# Patient Record
Sex: Male | Born: 1970
Health system: Southern US, Community
[De-identification: ages and names within clinical notes are randomized; demographics above are authoritative.]

## PROBLEM LIST (undated history)

## (undated) DIAGNOSIS — G4730 Sleep apnea, unspecified: Secondary | ICD-10-CM

## (undated) DIAGNOSIS — M109 Gout, unspecified: Secondary | ICD-10-CM

## (undated) DIAGNOSIS — E785 Hyperlipidemia, unspecified: Secondary | ICD-10-CM

## (undated) DIAGNOSIS — E119 Type 2 diabetes mellitus without complications: Secondary | ICD-10-CM

## (undated) DIAGNOSIS — M1990 Unspecified osteoarthritis, unspecified site: Secondary | ICD-10-CM

## (undated) DIAGNOSIS — I10 Essential (primary) hypertension: Secondary | ICD-10-CM

## (undated) HISTORY — DX: Type 2 diabetes mellitus without complications: E11.9

## (undated) HISTORY — DX: Essential (primary) hypertension: I10

## (undated) HISTORY — DX: Sleep apnea, unspecified: G47.30

## (undated) HISTORY — PX: BICEPS TENDON REPAIR: SHX566

## (undated) HISTORY — DX: Hyperlipidemia, unspecified: E78.5

## (undated) HISTORY — DX: Unspecified osteoarthritis, unspecified site: M19.90

## (undated) HISTORY — DX: Gout, unspecified: M10.9

---

## 2014-03-01 DIAGNOSIS — S40029A Contusion of unspecified upper arm, initial encounter: Secondary | ICD-10-CM | POA: Insufficient documentation

## 2014-03-01 DIAGNOSIS — N200 Calculus of kidney: Secondary | ICD-10-CM | POA: Insufficient documentation

## 2014-03-01 DIAGNOSIS — M542 Cervicalgia: Secondary | ICD-10-CM | POA: Insufficient documentation

## 2014-03-01 DIAGNOSIS — G5600 Carpal tunnel syndrome, unspecified upper limb: Secondary | ICD-10-CM | POA: Insufficient documentation

## 2014-03-01 DIAGNOSIS — G4733 Obstructive sleep apnea (adult) (pediatric): Secondary | ICD-10-CM | POA: Insufficient documentation

## 2014-03-01 DIAGNOSIS — M25521 Pain in right elbow: Secondary | ICD-10-CM | POA: Insufficient documentation

## 2014-03-01 DIAGNOSIS — R490 Dysphonia: Secondary | ICD-10-CM | POA: Insufficient documentation

## 2014-03-01 DIAGNOSIS — M25539 Pain in unspecified wrist: Secondary | ICD-10-CM | POA: Insufficient documentation

## 2014-03-01 DIAGNOSIS — M109 Gout, unspecified: Secondary | ICD-10-CM | POA: Insufficient documentation

## 2014-03-01 DIAGNOSIS — R7301 Impaired fasting glucose: Secondary | ICD-10-CM | POA: Insufficient documentation

## 2014-03-01 DIAGNOSIS — E785 Hyperlipidemia, unspecified: Secondary | ICD-10-CM | POA: Insufficient documentation

## 2014-03-01 DIAGNOSIS — G4730 Sleep apnea, unspecified: Secondary | ICD-10-CM | POA: Insufficient documentation

## 2014-03-01 DIAGNOSIS — S46219A Strain of muscle, fascia and tendon of other parts of biceps, unspecified arm, initial encounter: Secondary | ICD-10-CM | POA: Insufficient documentation

## 2016-07-22 HISTORY — PX: KNEE SURGERY: SHX244

## 2017-07-22 HISTORY — PX: HERNIA REPAIR: SHX51

## 2017-10-30 DIAGNOSIS — K429 Umbilical hernia without obstruction or gangrene: Secondary | ICD-10-CM | POA: Insufficient documentation

## 2017-11-27 DIAGNOSIS — Z09 Encounter for follow-up examination after completed treatment for conditions other than malignant neoplasm: Secondary | ICD-10-CM | POA: Insufficient documentation

## 2017-12-07 NOTE — Progress Notes (Signed)
[name] [address] [place], [place] [address] Phone: [phone] Subjective:    I'm seeing this patient by the request  of:    CC: right leg pain   [id number]  Jesse [name] is a 47 y.o. male coming in with complaint of right leg pain. Pain is in the right leg and has been there for 2 months in the anterior portion of the hip and radiates down past the knee and sometimes into the foot. If patient stands or walks for prolonged periods he does note a weakness in the leg. Patient does do Crossfit and does not have pain with activity.    Past medical history is significant for kidney stones as well as hernia repair even this month.  History reviewed. No pertinent past medical history. History reviewed. No pertinent surgical history. Social History   Socioeconomic History  . Marital status: Single    Spouse name: Not on file  . Number of children: Not on file  . Years of education: Not on file  . Highest education level: Not on file  Occupational History  . Not on file  Social Needs  . Financial resource strain: Not on file  . Food insecurity:    Worry: Not on file    Inability: Not on file  . Transportation needs:    Medical: Not on file    Non-medical: Not on file  Tobacco Use  . Smoking status: Not on file  Substance and Sexual Activity  . Alcohol use: Not on file  . Drug use: Not on file  . Sexual activity: Not on file  Lifestyle  . Physical activity:    Days per week: Not on file    Minutes per session: Not on file  . Stress: Not on file  Relationships  . Social connections:    Talks on phone: Not on file    Gets together: Not on file    Attends religious service: Not on file    Active member of club or organization: Not on file    Attends meetings of clubs or organizations: Not on file    Relationship status: Not on file  Other Topics Concern  . Not on file  Social History Narrative  . Not on file   Not on  File History reviewed. No pertinent family history.   Past medical history, social, surgical and family history all reviewed in electronic medical record.  No pertanent information unless stated regarding to the chief complaint.   Review of Systems:Review of systems updated and as accurate as of 12/08/17  No headache, visual changes, nausea, vomiting, diarrhea, constipation, dizziness, abdominal pain, skin rash, fevers, chills, night sweats, weight loss, swollen lymph nodes, body aches, joint swelling,, chest pain, shortness of breath, mood changes.  Mild positive muscle aches  Objective  Blood pressure 118/80, pulse 79, height 6' (1.829 m), weight 297 lb (134.7 kg), SpO2 95 %. Systems examined below as of 12/08/17   General: No apparent distress alert and oriented x3 mood and affect normal, dressed appropriately.  HEENT: Pupils equal, extraocular movements intact  Respiratory: Patient's speak in full sentences and does not appear short of breath  Cardiovascular: No lower extremity edema, non tender, no erythema  Skin: Warm dry intact with no signs of infection or rash on extremities or on axial skeleton.  Abdomen: Soft incisions from recent surgery noted  neuro: Cranial nerves II through XII are intact, neurovascularly intact in all extremities with 2+ DTRs  and 2+ pulses.  Lymph: No lymphadenopathy of posterior or anterior cervical chain or axillae bilaterally.  Gait normal with good balance and coordination.  MSK:  Non tender with full range of motion and good stability and symmetric strength and tone of shoulders, elbows, wrist,  knee and ankles bilaterally.  Right hip exam shows the patient does have near full range of motion but does have significant tightness with Corky Sox test.  Patient on palpation has no significant pain noted though.  Negative grind test.  Does have some tightness lacking last 5 degrees in all planes  Low back exam shows some loss of lordosis and some tightness of the  paraspinal musculature.  Negative straight leg test.  Once again positive Corky Sox.  Strength is symmetric.  [id number]; 15 additional minutes spent for Therapeutic exercises as stated in above notes.  This included exercises focusing on stretching, strengthening, with significant focus on eccentric aspects.   Long term goals include an improvement in range of motion, strength, endurance as well as avoiding reinjury. Patient's frequency would include in 1-2 times a day, 3-5 times a week for a duration of 6-12 weeks. Hip strengthening exercises which included:  Pelvic tilt/bracing to help with proper recruitment of the lower abs and pelvic floor muscles  Glute strengthening to properly contract glutes without over-engaging low back and hamstrings - prone hip extension and glute bridge exercises Proper stretching techniques to increase effectiveness for the hip flexors, groin, quads, piriformic and low back when appropriate     Proper technique shown and discussed handout in great detail with ATC.  All questions were discussed and answered.      Impression and Recommendations:     This case required medical decision making of moderate complexity.      Note: This dictation was prepared with Dragon dictation along with smaller phrase technology. Any transcriptional errors that result from this process are unintentional.

## 2017-12-08 ENCOUNTER — Ambulatory Visit (INDEPENDENT_AMBULATORY_CARE_PROVIDER_SITE_OTHER)
Admission: RE | Admit: 2017-12-08 | Discharge: 2017-12-08 | Disposition: A | Discharge status: Home / Self Care | Payer: Commercial Managed Care - PPO | Source: Ambulatory Visit | Attending: Family Medicine | Admitting: Family Medicine

## 2017-12-08 ENCOUNTER — Encounter: Payer: Self-pay | Admitting: Family Medicine

## 2017-12-08 ENCOUNTER — Ambulatory Visit: Payer: Commercial Managed Care - PPO | Admitting: Family Medicine

## 2017-12-08 VITALS — BP 118/80 | HR 79 | Ht 72.0 in | Wt 297.0 lb

## 2017-12-08 DIAGNOSIS — M545 Low back pain, unspecified: Secondary | ICD-10-CM

## 2017-12-08 DIAGNOSIS — M79604 Pain in right leg: Secondary | ICD-10-CM

## 2017-12-08 MED ORDER — VITAMIN D (ERGOCALCIFEROL) 1.25 MG (50000 UNIT) PO CAPS: 50000.0000 [IU] | ORAL_CAPSULE | ORAL | 0 refills | Status: DC

## 2017-12-08 MED ORDER — GABAPENTIN 100 MG PO CAPS: 200.0000 mg | ORAL_CAPSULE | Freq: Every day | ORAL | 3 refills | Status: DC

## 2017-12-08 NOTE — Assessment & Plan Note (Signed)
Right anterior leg pain.  Discussed with patient in great length.  Discussed gabapentin.  X-rays ordered today.  Discussed icing regimen.  Discussed which activities to do which wants to avoid.  We discussed compression.  Differential includes a lumbar radiculopathy and x-rays ordered today.  I do believe that also possibly impingement could be a possibility.  Significant tightness of the hip flexors.  We discussed the pectineus as well as the obturator muscles being also within the differential.  We discussed vascular but seems to get better with activity which would go against it.  We discussed muscle strength and endurance.

## 2017-12-08 NOTE — Patient Instructions (Addendum)
Good to meet you  I think it is your hip flexor or your obturator or petineus muscle.  We will figure it out and focus on hip girdle strength and stretching first  Gabapentin 200mg  at night to help with any nerve pain  Over the counter try Tart cherry extract any dose at night When we do work out again will need compression shorts Xrays downstairs today  See me again in 4 weeks

## 2018-01-04 NOTE — Progress Notes (Signed)
[name] [place], [place] [address] Phone: [phone] Subjective:    I'm seeing this patient by the request  of:    CC: Back pain and right leg pain  [id number]  Jesse [name] is a 47 y.o. male coming in with complaint of back pain. He does continue to have numbness in the right leg. Is able to be active but does have numbness with activity.  Patient feeling rare worsening overall.  Patient states that it seems like he is having increasing weakness of the right leg.  Waking him up at night.  Even regular daily activity seems to be more difficult.     No past medical history on file. No past surgical history on file. Social History   Socioeconomic History  . Marital status: Single    Spouse name: Not on file  . Number of children: Not on file  . Years of education: Not on file  . Highest education level: Not on file  Occupational History  . Not on file  Social Needs  . Financial resource strain: Not on file  . Food insecurity:    Worry: Not on file    Inability: Not on file  . Transportation needs:    Medical: Not on file    Non-medical: Not on file  Tobacco Use  . Smoking status: Not on file  Substance and Sexual Activity  . Alcohol use: Not on file  . Drug use: Not on file  . Sexual activity: Not on file  Lifestyle  . Physical activity:    Days per week: Not on file    Minutes per session: Not on file  . Stress: Not on file  Relationships  . Social connections:    Talks on phone: Not on file    Gets together: Not on file    Attends religious service: Not on file    Active member of club or organization: Not on file    Attends meetings of clubs or organizations: Not on file    Relationship status: Not on file  Other Topics Concern  . Not on file  Social History Narrative  . Not on file   Not on File No family history on file.   Past medical history, social, surgical and family history all reviewed in  electronic medical record.  No pertanent information unless stated regarding to the chief complaint.   Review of Systems:Review of systems updated and as accurate as of 01/04/18  No headache, visual changes, nausea, vomiting, diarrhea, constipation, dizziness, abdominal pain, skin rash, fevers, chills, night sweats, weight loss, swollen lymph nodes, body aches, joint swelling, muscle aches, chest pain, shortness of breath, mood changes.   Objective  There were no vitals taken for this visit. Systems examined below as of 01/04/18   General: No apparent distress alert and oriented x3 mood and affect normal, dressed appropriately.  HEENT: Pupils equal, extraocular movements intact  Respiratory: Patient's speak in full sentences and does not appear short of breath  Cardiovascular: No lower extremity edema, non tender, no erythema  Skin: Warm dry intact with no signs of infection or rash on extremities or on axial skeleton.  Abdomen: Soft nondistended.  Patient did recently have surgery for umbilical hernia. Neuro: Cranial nerves II through XII are intact, neurovascularly intact in all extremities with and 2+ pulses.  Lymph: No lymphadenopathy of posterior or anterior cervical chain or axillae bilaterally.  Gait mild antalgic MSK:  Non tender with  full range of motion and good stability and symmetric strength and tone of shoulders, elbows, wrist, knee and ankles bilaterally.  Patient's lumbar spine shows loss of lordosis.  Poor core strength noted.  Patient does have a positive straight leg test on the right side.  4 out of 5 strength limbs on plantarflexion and dorsiflexion on the right.  Deep tendon reflexes 1+ on the right Achilles compared to the contralateral side. Unable to do [facility] test secondary to tightness of the hip girdle. Left leg fairly unremarkable.    Impression and Recommendations:     This case required medical decision making of moderate complexity.      Note: This  dictation was prepared with Dragon dictation along with smaller phrase technology. Any transcriptional errors that result from this process are unintentional.

## 2018-01-05 ENCOUNTER — Ambulatory Visit (INDEPENDENT_AMBULATORY_CARE_PROVIDER_SITE_OTHER): Payer: Commercial Managed Care - PPO | Admitting: Family Medicine

## 2018-01-05 ENCOUNTER — Ambulatory Visit: Payer: Self-pay

## 2018-01-05 VITALS — BP 140/80 | HR 82 | Ht 72.0 in | Wt 304.0 lb

## 2018-01-05 DIAGNOSIS — R200 Anesthesia of skin: Secondary | ICD-10-CM

## 2018-01-05 DIAGNOSIS — M5416 Radiculopathy, lumbar region: Secondary | ICD-10-CM | POA: Diagnosis not present

## 2018-01-05 NOTE — Patient Instructions (Signed)
Good to see you  Jesse [name] is your friend.  Stay active We will get MRI of your back due to the :weakness":  I will write you on the findings I would like to see you again in 3 weeks no matter what

## 2018-01-05 NOTE — Assessment & Plan Note (Signed)
Patient was found to have more of a lumbar radiculopathy.  Discussed with patient in great length.  We discussed icing regimen.  Patient was having weakness.  Patient has had this long-standing for some time.  I do believe that advanced imaging would be beneficial at this point.  Patient will have an MRI of the back.  X-rays have already shown some mild osteoarthritic changes.  Patient has weakness of the lower extremity and even decreasing tendon reflexes.  Could be a candidate for epidural injections.  In addition to this encourage patient to increase the gabapentin to 300 mg in the interim.  Patient was worsening symptoms to seek medical attention immediately.  There was a concern for patient having an atypical hernia of the upper inner but I think this is a low likelihood compared to the findings noted today.

## 2018-01-12 ENCOUNTER — Encounter: Payer: Self-pay | Admitting: Family Medicine

## 2018-01-12 ENCOUNTER — Other Ambulatory Visit: Payer: Self-pay

## 2018-01-12 DIAGNOSIS — G8929 Other chronic pain: Secondary | ICD-10-CM

## 2018-01-12 DIAGNOSIS — M545 Low back pain: Principal | ICD-10-CM

## 2018-01-12 NOTE — Telephone Encounter (Signed)
Done

## 2018-01-23 ENCOUNTER — Ambulatory Visit
Admission: RE | Admit: 2018-01-23 | Discharge: 2018-01-23 | Disposition: A | Discharge status: Home / Self Care | Payer: Commercial Managed Care - PPO | Source: Ambulatory Visit | Attending: Family Medicine | Admitting: Family Medicine

## 2018-01-23 DIAGNOSIS — G8929 Other chronic pain: Secondary | ICD-10-CM

## 2018-01-23 DIAGNOSIS — M545 Low back pain: Principal | ICD-10-CM

## 2018-01-23 MED ORDER — METHYLPREDNISOLONE ACETATE 40 MG/ML INJ SUSP (RADIOLOG
120.0000 mg | Freq: Once | INTRAMUSCULAR | Status: AC
  Administered 2018-01-23: 120 mg via EPIDURAL

## 2018-01-23 MED ORDER — IOPAMIDOL (ISOVUE-M 200) INJECTION 41%
1.0000 mL | Freq: Once | INTRAMUSCULAR | Status: AC
  Administered 2018-01-23: 1 mL via EPIDURAL

## 2018-01-23 NOTE — Discharge Instructions (Signed)

## 2018-02-04 NOTE — Progress Notes (Signed)
[name] [address] [place], [place] [address] Phone: [phone] Subjective:     CC: Back pain  [id number]  Jesse [name] is a 47 y.o. male coming in with complaint of back pain. He had an epidural which helped the numbness in the right leg. He does feel like the numbness is starting to come back. Denies any pain in the back.  Doing well with the gabapentin.  Has tried 300 mg.  MRI did show multilevel degenerative changes within L4-L5 3 foraminal stenosis causing nerve impingement.  Also had a possible pars defect at L5    No past medical history on file. No past surgical history on file. Social History   Socioeconomic History  . Marital status: Single    Spouse name: Not on file  . Number of children: Not on file  . Years of education: Not on file  . Highest education level: Not on file  Occupational History  . Not on file  Social Needs  . Financial resource strain: Not on file  . Food insecurity:    Worry: Not on file    Inability: Not on file  . Transportation needs:    Medical: Not on file    Non-medical: Not on file  Tobacco Use  . Smoking status: Not on file  Substance and Sexual Activity  . Alcohol use: Not on file  . Drug use: Not on file  . Sexual activity: Not on file  Lifestyle  . Physical activity:    Days per week: Not on file    Minutes per session: Not on file  . Stress: Not on file  Relationships  . Social connections:    Talks on phone: Not on file    Gets together: Not on file    Attends religious service: Not on file    Active member of club or organization: Not on file    Attends meetings of clubs or organizations: Not on file    Relationship status: Not on file  Other Topics Concern  . Not on file  Social History Narrative  . Not on file   Not on File No family history on file.   Past medical history, social, surgical and family history all reviewed in electronic medical record.  No pertanent  information unless stated regarding to the chief complaint.   Review of Systems:Review of systems updated and as accurate as of 02/05/18  No headache, visual changes, nausea, vomiting, diarrhea, constipation, dizziness, abdominal pain, skin rash, fevers, chills, night sweats, weight loss, swollen lymph nodes, body aches, joint swelling, muscle aches, chest pain, shortness of breath, mood changes.   Objective  Blood pressure 128/86, pulse (!) 105, height 6' (1.829 m), weight (!) 303 lb (137.4 kg), SpO2 96 %. Systems examined below as of 02/05/18   General: No apparent distress alert and oriented x3 mood and affect normal, dressed appropriately.  HEENT: Pupils equal, extraocular movements intact  Respiratory: Patient's speak in full sentences and does not appear short of breath  Cardiovascular: No lower extremity edema, non tender, no erythema  Skin: Warm dry intact with no signs of infection or rash on extremities or on axial skeleton.  Abdomen: Soft nontender  Neuro: Cranial nerves II through XII are intact, neurovascularly intact in all extremities with 2+ DTRs and 2+ pulses.  Lymph: No lymphadenopathy of posterior or anterior cervical chain or axillae bilaterally.  Gait normal with good balance and coordination.  MSK:  Non tender with full  range of motion and good stability and symmetric strength and tone of shoulders, elbows, wrist, hip, knee and ankles bilaterally.  Back Exam:  Inspection: Mild loss of lordosis Motion: Flexion 45 deg, Extension 20 deg, Side Bending to 25 deg bilaterally,  Rotation to 25 deg bilaterally  SLR laying: Negative  XSLR laying: Negative  Palpable tenderness: Tender to palpation the paraspinal musculature.  Seems to be more on the right side of the left. FABER: Severe tightness bilaterally especially on the right. Sensory change: Gross sensation intact to all lumbar and sacral dermatomes.  Reflexes: 2+ at both patellar tendons, 2+ at achilles tendons,  Babinski's downgoing.  Strength at foot  Plantar-flexion: 5/5 Dorsi-flexion: 5/5 Eversion: 5/5 Inversion: 5/5  Leg strength  Quad: 5/5 Hamstring: 5/5 Hip flexor: 5/5 Hip abductors: 4/5 but symmetric Gait unremarkable.    Impression and Recommendations:     This case required medical decision making of moderate complexity.      Note: This dictation was prepared with Dragon dictation along with smaller phrase technology. Any transcriptional errors that result from this process are unintentional.

## 2018-02-05 ENCOUNTER — Ambulatory Visit (INDEPENDENT_AMBULATORY_CARE_PROVIDER_SITE_OTHER): Payer: Commercial Managed Care - PPO | Admitting: Family Medicine

## 2018-02-05 DIAGNOSIS — M5416 Radiculopathy, lumbar region: Secondary | ICD-10-CM

## 2018-02-05 NOTE — Patient Instructions (Addendum)
Good to see you  Jesse [name] is your friend.  Try our exercises 2-3 times a week  Ok to increase activity as tolerated Play with the gabapentin and see if 300mg  helps and then can refill at that dose if you want.  See me again in 4-6 weeks to make sure you are doing well

## 2018-02-05 NOTE — Assessment & Plan Note (Signed)
Lumbar radiculopathy.  We discussed icing regimen and home exercises.  Encourage patient to continue with the gabapentin.  Is okay to increase activity.  If worsening symptoms may need to consider another epidural.  Follow-up again in 4 to 6 weeks

## 2018-03-05 ENCOUNTER — Encounter: Payer: Self-pay | Admitting: Family Medicine

## 2018-03-05 ENCOUNTER — Ambulatory Visit (INDEPENDENT_AMBULATORY_CARE_PROVIDER_SITE_OTHER): Payer: Commercial Managed Care - PPO | Admitting: Family Medicine

## 2018-03-05 DIAGNOSIS — M79604 Pain in right leg: Secondary | ICD-10-CM

## 2018-03-05 NOTE — Assessment & Plan Note (Signed)
Possible labral pathology noted of the hip now with a positive grind test.  We discussed if continuing to have pain at follow-up we will consider the possibility of injection.  Patient will consider this.  Continue with conservative therapy at this time.  Follow-up again in 4 to 6 weeks

## 2018-03-05 NOTE — Progress Notes (Signed)
[name] [address], [place] [address] Phone: [phone] Subjective:      CC: Back pain  [id number]  Jesse [name] is a 47 y.o. male coming in with complaint of back pain. The numbness in the right leg has improved but does increase with prolonged walking. Is developing pain on left side of lumbar spine. Pain increases with sitting or laying for prolonged periods.    Patient did have x-rays of the pelvis done Dec 08, 2017 showing minimal degenerative changes of the hips.  MRI of the back did show mild arthritic changes but did have a pars defect noted at L5-S1  No past medical history on file. No past surgical history on file. Social History   Socioeconomic History  . Marital status: Single    Spouse name: Not on file  . Number of children: Not on file  . Years of education: Not on file  . Highest education level: Not on file  Occupational History  . Not on file  Social Needs  . Financial resource strain: Not on file  . Food insecurity:    Worry: Not on file    Inability: Not on file  . Transportation needs:    Medical: Not on file    Non-medical: Not on file  Tobacco Use  . Smoking status: Not on file  Substance and Sexual Activity  . Alcohol use: Not on file  . Drug use: Not on file  . Sexual activity: Not on file  Lifestyle  . Physical activity:    Days per week: Not on file    Minutes per session: Not on file  . Stress: Not on file  Relationships  . Social connections:    Talks on phone: Not on file    Gets together: Not on file    Attends religious service: Not on file    Active member of club or organization: Not on file    Attends meetings of clubs or organizations: Not on file    Relationship status: Not on file  Other Topics Concern  . Not on file  Social History Narrative  . Not on file   Not on File No family history on file.  No family history of autoimmune   Past medical history, social,  surgical and family history all reviewed in electronic medical record.  No pertanent information unless stated regarding to the chief complaint.   Review of Systems:Review of systems updated and as accurate as of 03/05/18  No headache, visual changes, nausea, vomiting, diarrhea, constipation, dizziness, abdominal pain, skin rash, fevers, chills, night sweats, weight loss, swollen lymph nodes, body aches, joint swelling, chest pain, shortness of breath, mood changes.  Positive muscle aches  Objective  Blood pressure 128/82, pulse (!) 111, height 6' (1.829 m), weight (!) 303 lb (137.4 kg), SpO2 96 %. Systems examined below as of 03/05/18   General: No apparent distress alert and oriented x3 mood and affect normal, dressed appropriately.  HEENT: Pupils equal, extraocular movements intact  Respiratory: Patient's speak in full sentences and does not appear short of breath  Cardiovascular: No lower extremity edema, non tender, no erythema  Skin: Warm dry intact with no signs of infection or rash on extremities or on axial skeleton.  Abdomen: Soft nontender  Neuro: Cranial nerves II through XII are intact, neurovascularly intact in all extremities with 2+ DTRs and 2+ pulses.  Lymph: No lymphadenopathy of posterior or anterior cervical chain or axillae bilaterally.  Gait normal with good balance and coordination.  MSK:  Non tender with full range of motion and good stability and symmetric strength and tone of shoulders, elbows, wrist,  knee and ankles bilaterally.    Right hip exam shows that patient does have some decreased range of motion of all planes of known positive grind test which is new.  Back exam shows significant tightness of the lumbar spine.  Negative straight leg test.  Mild positive Corky Sox test on the right side.  Neurovascular intact distally with 5 out of 5 strength   Impression and Recommendations:     This case required medical decision making of moderate complexity.       Note: This dictation was prepared with Dragon dictation along with smaller phrase technology. Any transcriptional errors that result from this process are unintentional.

## 2018-03-05 NOTE — Patient Instructions (Signed)
Hip labral tear See me in 3 weeks.

## 2018-03-09 ENCOUNTER — Telehealth: Payer: Self-pay | Admitting: *Deleted

## 2018-03-09 MED ORDER — GABAPENTIN 100 MG PO CAPS: 200.0000 mg | ORAL_CAPSULE | Freq: Every day | ORAL | 1 refills | Status: DC

## 2018-03-09 MED ORDER — VITAMIN D (ERGOCALCIFEROL) 1.25 MG (50000 UNIT) PO CAPS: 50000.0000 [IU] | ORAL_CAPSULE | ORAL | 0 refills | Status: DC

## 2018-03-09 NOTE — Addendum Note (Signed)
Addended by: Douglass Rivers T on: 03/09/2018 01:39 PM   Modules accepted: Orders

## 2018-03-09 NOTE — Telephone Encounter (Signed)
Refill request for Gabapentin 100mg .  rx sent to pharmacy.

## 2018-03-25 ENCOUNTER — Ambulatory Visit (INDEPENDENT_AMBULATORY_CARE_PROVIDER_SITE_OTHER): Payer: Commercial Managed Care - PPO | Admitting: Family Medicine

## 2018-03-25 ENCOUNTER — Encounter: Payer: Self-pay | Admitting: Family Medicine

## 2018-03-25 VITALS — BP 160/90 | HR 74 | Ht 72.0 in | Wt 306.0 lb

## 2018-03-25 DIAGNOSIS — M79604 Pain in right leg: Secondary | ICD-10-CM

## 2018-03-25 DIAGNOSIS — R0989 Other specified symptoms and signs involving the circulatory and respiratory systems: Secondary | ICD-10-CM | POA: Diagnosis not present

## 2018-03-25 DIAGNOSIS — M25551 Pain in right hip: Secondary | ICD-10-CM

## 2018-03-25 NOTE — Progress Notes (Signed)
[name] [address], [place] [address] Phone: [phone] Subjective:     I [name] am serving as a Education administrator for Dr. Hulan Saas.   CC: Right leg pain follow-up  [id number]  Jesse [name] is a 47 y.o. male coming in with complaint of right leg pain. States that his leg is the same as last visit. Gets numbness past his knee. Was seen previously back and did not find that patient did have some degenerative disc disease in the attempted a epidural.  Helped him with his back pain but continues to have more of the hip pain.  Still having this anterior pain in the hip and abdominal region.  Very intermittent.  Sometimes with activity and sometimes not.  Patient states that standing for long amount of time seems to aggravate it.  Patient states that he was able to work out without any significant pain this morning though.  Patient though is concerned because he is still not able to do all the activities he would like.  Been going on now multiple months and has not had any significant improvement.     No past medical history on file. No past surgical history on file. Social History   Socioeconomic History  . Marital status: Single    Spouse name: Not on file  . Number of children: Not on file  . Years of education: Not on file  . Highest education level: Not on file  Occupational History  . Not on file  Social Needs  . Financial resource strain: Not on file  . Food insecurity:    Worry: Not on file    Inability: Not on file  . Transportation needs:    Medical: Not on file    Non-medical: Not on file  Tobacco Use  . Smoking status: Not on file  Substance and Sexual Activity  . Alcohol use: Not on file  . Drug use: Not on file  . Sexual activity: Not on file  Lifestyle  . Physical activity:    Days per week: Not on file    Minutes per session: Not on file  . Stress: Not on file  Relationships  . Social connections:    Talks  on phone: Not on file    Gets together: Not on file    Attends religious service: Not on file    Active member of club or organization: Not on file    Attends meetings of clubs or organizations: Not on file    Relationship status: Not on file  Other Topics Concern  . Not on file  Social History Narrative  . Not on file   Not on File No family history on file.   Current Outpatient Medications (Cardiovascular):  .  fenofibrate (TRICOR) 48 MG tablet, Take 200 mg by mouth daily.  [name]  lisinopril (PRINIVIL,ZESTRIL) 20 MG tablet, Take 20 mg by mouth daily.   Current Outpatient Medications (Analgesics):  .  allopurinol (ZYLOPRIM) 300 MG tablet, Take 300 mg by mouth daily.   Current Outpatient Medications (Other):  .  acyclovir (ZOVIRAX) 400 MG tablet, Take 400 mg by mouth 2 (two) times daily.  [name]  buPROPion (WELLBUTRIN) 75 MG tablet, Take 300 mg by mouth daily.  [name]  gabapentin (NEURONTIN) 100 MG capsule, Take 2 capsules (200 mg total) by mouth at bedtime. .  Vitamin D, Ergocalciferol, (DRISDOL) 50000 units CAPS capsule, Take 1 capsule (50,000 Units total) by mouth every 7 (seven) days.  Past medical history, social, surgical and family history all reviewed in electronic medical record.  No pertanent information unless stated regarding to the chief complaint.   Review of Systems:  No headache, visual changes, nausea, vomiting, diarrhea, constipation, dizziness, abdominal pain, skin rash, fevers, chills, night sweats, weight loss, swollen lymph nodes, body aches, joint swelling,, chest pain, shortness of breath, mood changes.  Positive muscle aches  Objective  Blood pressure (!) 160/90, pulse 74, height 6' (1.829 m), weight (!) 306 lb (138.8 kg), SpO2 96 %.    General: No apparent distress alert and oriented x3 mood and affect normal, dressed appropriately.  HEENT: Pupils equal, extraocular movements intact  Respiratory: Patient's speak in full sentences and does not appear short of  breath  Cardiovascular: No lower extremity edema, non tender, no erythema  Skin: Warm dry intact with no signs of infection or rash on extremities or on axial skeleton.  Abdomen: Soft mild tenderness in the right lower quadrant. Neuro: Cranial nerves II through XII are intact, neurovascularly intact in all extremities with 2+ DTRs and 2+ pulses.  Lymph: No lymphadenopathy of posterior or anterior cervical chain or axillae bilaterally.  Gait normal with good balance and coordination.  MSK:  Non tender with full range of motion and good stability and symmetric strength and tone of shoulders, elbows, wrist,  knee and ankles bilaterally.  Back exam is improved with very minimal tenderness.  Patient does have a positive Corky Sox on the right hip increased pain with grind test of the right hip and internal rotation.  Patient though does have some pain with flexion of the hip but more anterior than posterior.  No radicular symptoms.  Neurovascular intact distally.    Impression and Recommendations:     This case required medical decision making of moderate complexity. The above documentation has been reviewed and is accurate and complete [name], DO       Note: This dictation was prepared with Dragon dictation along with smaller phrase technology. Any transcriptional errors that result from this process are unintentional.

## 2018-03-25 NOTE — Assessment & Plan Note (Signed)
Very difficult to assess at this time.  Patient has done all the formal physical therapy, home exercise, multiple medications.  Laboratory work-up fairly unremarkable.  Doing the once weekly vitamin D as well as taking gout medications with no significant improvement overall.  Patient's pain seems to come and go.  Differential includes a hip labral tear that is not seen at the moment or even an occult hernia.  Hip flexor tendinitis could be within the differential.  No fevers or chills and likely not an abscess.  Patient though could have some possible pseudoclaudication occurring as well and I feel that an MRI of the pelvis with and without contrast as well as an MRI arthrogram of the hip would be beneficial at this point to help with aid in proper treatment.  Patient is in agreement with the plan and will follow-up with me after imaging to discuss further.  Spent  25 minutes with patient face-to-face and had greater than 50% of counseling including as described above in assessment and plan.

## 2018-03-25 NOTE — Patient Instructions (Signed)
Good to see you  Ice 20 minutes 2 times daily. Usually after activity and before bed. MRI are ordered and give it a day then call [phone] to schedule.  Continue the gabapentin  I will also start exercises for the shoulders Keep hands within peripheral vision  On wall with heels, butt shoulder and head touching for a goal of 5 minutes daily  We will try for an adjustable standing desk  I will write you when we get the MRI back but make an appointment to have on the books in 4 weeks

## 2018-04-05 ENCOUNTER — Ambulatory Visit
Admission: RE | Admit: 2018-04-05 | Discharge: 2018-04-05 | Disposition: A | Discharge status: Home / Self Care | Payer: Commercial Managed Care - PPO | Source: Ambulatory Visit | Attending: Family Medicine | Admitting: Family Medicine

## 2018-04-05 DIAGNOSIS — R0989 Other specified symptoms and signs involving the circulatory and respiratory systems: Secondary | ICD-10-CM

## 2018-04-05 MED ORDER — GADOBENATE DIMEGLUMINE 529 MG/ML IV SOLN
20.0000 mL | Freq: Once | INTRAVENOUS | Status: AC | PRN
  Administered 2018-04-05: 20 mL via INTRAVENOUS

## 2018-04-06 ENCOUNTER — Encounter: Payer: Self-pay | Admitting: Family Medicine

## 2018-04-16 ENCOUNTER — Ambulatory Visit
Admission: RE | Admit: 2018-04-16 | Discharge: 2018-04-16 | Disposition: A | Discharge status: Home / Self Care | Payer: Commercial Managed Care - PPO | Source: Ambulatory Visit | Attending: Family Medicine | Admitting: Family Medicine

## 2018-04-16 DIAGNOSIS — M25551 Pain in right hip: Secondary | ICD-10-CM

## 2018-04-16 MED ORDER — IOPAMIDOL (ISOVUE-M 200) INJECTION 41%
15.0000 mL | Freq: Once | INTRAMUSCULAR | Status: AC
  Administered 2018-04-16: 15 mL via INTRA_ARTICULAR

## 2018-04-25 NOTE — Progress Notes (Signed)
[name] [place], [place] [address] Phone: [phone] Subjective:    I Jesse [name] am serving as a Education administrator for Dr. Hulan Saas.   CC: Leg pain follow-up  [id number]  Jesse [name] is a 47 y.o. male coming in with complaint of right hip pain. States that his hip is not doing well. Has had 2 MRI's since the last visit.  These MRIs were independently visualized by me.  Patient's pelvic MRI did show the patient did have degenerative tearing of the labrum noted but also advanced osteoarthritic changes of the hip but seems to be asymmetric.  Seems to be only on the right side significantly.        No past medical history on file. No past surgical history on file. Social History   Socioeconomic History  . Marital status: Single    Spouse name: Not on file  . Number of children: Not on file  . Years of education: Not on file  . Highest education level: Not on file  Occupational History  . Not on file  Social Needs  . Financial resource strain: Not on file  . Food insecurity:    Worry: Not on file    Inability: Not on file  . Transportation needs:    Medical: Not on file    Non-medical: Not on file  Tobacco Use  . Smoking status: Not on file  Substance and Sexual Activity  . Alcohol use: Not on file  . Drug use: Not on file  . Sexual activity: Not on file  Lifestyle  . Physical activity:    Days per week: Not on file    Minutes per session: Not on file  . Stress: Not on file  Relationships  . Social connections:    Talks on phone: Not on file    Gets together: Not on file    Attends religious service: Not on file    Active member of club or organization: Not on file    Attends meetings of clubs or organizations: Not on file    Relationship status: Not on file  Other Topics Concern  . Not on file  Social History Narrative  . Not on file   Not on File No family history on file.   Current Outpatient  Medications (Cardiovascular):  .  fenofibrate (TRICOR) 48 MG tablet, Take 200 mg by mouth daily.  [name]  lisinopril (PRINIVIL,ZESTRIL) 20 MG tablet, Take 20 mg by mouth daily.   Current Outpatient Medications (Analgesics):  .  allopurinol (ZYLOPRIM) 300 MG tablet, Take 300 mg by mouth daily. .  meloxicam (MOBIC) 15 MG tablet, Take 1 tablet (15 mg total) by mouth daily.   Current Outpatient Medications (Other):  .  acyclovir (ZOVIRAX) 400 MG tablet, Take 400 mg by mouth 2 (two) times daily.  [name]  buPROPion (WELLBUTRIN) 75 MG tablet, Take 300 mg by mouth daily.  [name]  gabapentin (NEURONTIN) 100 MG capsule, Take 2 capsules (200 mg total) by mouth at bedtime. .  Vitamin D, Ergocalciferol, (DRISDOL) 50000 units CAPS capsule, Take 1 capsule (50,000 Units total) by mouth every 7 (seven) days.    Past medical history, social, surgical and family history all reviewed in electronic medical record.  No pertanent information unless stated regarding to the chief complaint.   Review of Systems:  No headache, visual changes, nausea, vomiting, diarrhea, constipation, dizziness, abdominal pain, skin rash, fevers, chills, night sweats, weight loss, swollen lymph nodes, body aches,  joint swelling,  chest pain, shortness of breath, mood changes.  Positive muscle aches  Objective  Blood pressure (!) 160/82, pulse 81, height 6' (1.829 m), weight (!) 306 lb (138.8 kg), SpO2 96 %.   General: No apparent distress alert and oriented x3 mood and affect normal, dressed appropriately.  HEENT: Pupils equal, extraocular movements intact  Respiratory: Patient's speak in full sentences and does not appear short of breath  Cardiovascular: No lower extremity edema, non tender, no erythema  Skin: Warm dry intact with no signs of infection or rash on extremities or on axial skeleton.  Abdomen: Soft nontender  Neuro: Cranial nerves II through XII are intact, neurovascularly intact in all extremities with 2+ DTRs and 2+ pulses.    Lymph: No lymphadenopathy of posterior or anterior cervical chain or axillae bilaterally.  Gait antalgic MSK:  Non tender with full range of motion and good stability and symmetric strength and tone of shoulders, elbows, wrist,  knee and ankles bilaterally.  Right hip exam shows decreased range of motion in all planes with only 5 degrees of internal range of motion.  Negative straight leg test.  Does have 4 out of 5 strength of hip flexion.     Impression and Recommendations:     The above documentation has been reviewed and is accurate and complete Jesse Pulley, DO       Note: This dictation was prepared with Dragon dictation along with smaller phrase technology. Any transcriptional errors that result from this process are unintentional.

## 2018-04-27 ENCOUNTER — Encounter: Payer: Self-pay | Admitting: *Deleted

## 2018-04-27 ENCOUNTER — Encounter: Payer: Self-pay | Admitting: Family Medicine

## 2018-04-27 ENCOUNTER — Ambulatory Visit (INDEPENDENT_AMBULATORY_CARE_PROVIDER_SITE_OTHER): Payer: Commercial Managed Care - PPO | Admitting: Family Medicine

## 2018-04-27 VITALS — BP 160/82 | HR 81 | Ht 72.0 in | Wt 306.0 lb

## 2018-04-27 DIAGNOSIS — M25551 Pain in right hip: Secondary | ICD-10-CM

## 2018-04-27 DIAGNOSIS — M1611 Unilateral primary osteoarthritis, right hip: Secondary | ICD-10-CM | POA: Diagnosis not present

## 2018-04-27 MED ORDER — MELOXICAM 15 MG PO TABS: 15.0000 mg | ORAL_TABLET | Freq: Every day | ORAL | 3 refills | Status: DC

## 2018-04-27 NOTE — Patient Instructions (Addendum)
Good to see you  Ice is your friend Continue the vitamins We will get you in with [place].  They will talk after and write me and we will discuss the options.  Meloxicam daily for 10 days then as needed

## 2018-04-27 NOTE — Assessment & Plan Note (Signed)
Moderate to severe with patient having a degenerative labral tear.  Will refer to orthopedic surgery to see if any possibility of resurfacing as well as labral repair or removal could be beneficial in the same patient from having a hip replacement at the age of 48.  We discussed and encourage patient to continue to stay active but avoid high impact.  We discussed weight loss.  Discussed all other conservative measures.  Patient will discuss with orthopedic surgery in the come back and discuss with [place].  Spent  25 minutes with patient face-to-face and had greater than 50% of counseling including as described above in assessment and plan.

## 2018-05-15 ENCOUNTER — Other Ambulatory Visit: Payer: Self-pay | Admitting: Family Medicine

## 2018-05-15 NOTE — Telephone Encounter (Signed)
Refill done.  

## 2018-07-20 ENCOUNTER — Encounter: Payer: Self-pay | Admitting: Family Medicine

## 2018-07-29 NOTE — Progress Notes (Signed)
[name] [address] [place], [place] [address] Phone: [phone] Subjective:    CC: Right hip pain  [id number]:[id number]  Jesse [name] is a 48 y.o. male coming in with complaint of right hip pain.  Patient has had this for quite some time.  Was sent for an MRI and MRI did show severe osteoarthritic changes of the hip.  Patient states that he did see surgeon. Was told he would have to have replacement in 2-5 years.   Patient also has started to have left hip pain. Has radiating pain into left leg. Feels like it coming from his back. Does feel like it is improving. Pain is anterior and travels through quad to lateral gastroc. Also has some pain over lateral hip. Does wake up from pain. Sitting and lying down increases his pain while walking improves his pain.     No past medical history on file. No past surgical history on file. Social History   Socioeconomic History  . Marital status: Single    Spouse name: Not on file  . Number of children: Not on file  . Years of education: Not on file  . Highest education level: Not on file  Occupational History  . Not on file  Social Needs  . Financial resource strain: Not on file  . Food insecurity:    Worry: Not on file    Inability: Not on file  . Transportation needs:    Medical: Not on file    Non-medical: Not on file  Tobacco Use  . Smoking status: Not on file  Substance and Sexual Activity  . Alcohol use: Not on file  . Drug use: Not on file  . Sexual activity: Not on file  Lifestyle  . Physical activity:    Days per week: Not on file    Minutes per session: Not on file  . Stress: Not on file  Relationships  . Social connections:    Talks on phone: Not on file    Gets together: Not on file    Attends religious service: Not on file    Active member of club or organization: Not on file    Attends meetings of clubs or organizations: Not on file    Relationship status: Not on file  Other Topics  Concern  . Not on file  Social History Narrative  . Not on file   Not on File No family history on file.   Current Outpatient Medications (Cardiovascular):  .  fenofibrate (TRICOR) 48 MG tablet, Take 200 mg by mouth daily.  [name]  lisinopril (PRINIVIL,ZESTRIL) 20 MG tablet, Take 20 mg by mouth daily.   Current Outpatient Medications (Analgesics):  .  allopurinol (ZYLOPRIM) 300 MG tablet, Take 300 mg by mouth daily. .  meloxicam (MOBIC) 15 MG tablet, Take 1 tablet (15 mg total) by mouth daily.   Current Outpatient Medications (Other):  .  acyclovir (ZOVIRAX) 400 MG tablet, Take 400 mg by mouth 2 (two) times daily.  [name]  buPROPion (WELLBUTRIN) 75 MG tablet, Take 300 mg by mouth daily.  [name]  gabapentin (NEURONTIN) 100 MG capsule, Take 2 capsules (200 mg total) by mouth at bedtime. .  Vitamin D, Ergocalciferol, (DRISDOL) 50000 units CAPS capsule, TAKE 1 CAPSULE EVERY 7 DAYS    Past medical history, social, surgical and family history all reviewed in electronic medical record.  No pertanent information unless stated regarding to the chief complaint.   Review of Systems:  No headache,  visual changes, nausea, vomiting, diarrhea, constipation, dizziness, abdominal pain, skin rash, fevers, chills, night sweats, weight loss, swollen lymph nodes, body aches, joint swelling, chest pain, shortness of breath, mood changes.  Positive muscle aches  Objective  There were no vitals taken for this visit.    General: No apparent distress alert and oriented x3 mood and affect normal, dressed appropriately.  HEENT: Pupils equal, extraocular movements intact  Respiratory: Patient's speak in full sentences and does not appear short of breath  Cardiovascular: No lower extremity edema, non tender, no erythema  Skin: Warm dry intact with no signs of infection or rash on extremities or on axial skeleton.  Abdomen: Soft nontender  Neuro: Cranial nerves II through XII are intact, neurovascularly intact in all  extremities with 2+ DTRs and 2+ pulses.  Lymph: No lymphadenopathy of posterior or anterior cervical chain or axillae bilaterally.  Gait normal with good balance and coordination.  MSK:  Non tender with full range of motion and good stability and symmetric strength and tone of shoulders, elbows, wrist, knee and ankles bilaterally.  Hip: Right ROM IR: 5 Deg, ER: 45 Deg, Flexion: 120 Deg, Extension: 100 Deg, Abduction: 45 Deg, Adduction: 45 Deg Strength IR: 5/5, ER: 5/5, Flexion: 5/5, Extension: 5/5, Abduction: 4/5, Adduction: 5/5 Pelvic alignment unremarkable to inspection and palpation. Standing hip rotation and gait without trendelenburg sign / unsteadiness. Positive pain with internal rotation No SI joint tenderness and normal minimal SI movement.  Low back exam shows loss of lordosis.  Mild positive straight leg test of the left leg at 25 degrees.  Full strength of the leg noted.  Deep tendon reflexes intact.  Patient does have poor core strength.  Osteopathic findings  C6 flexed rotated and side bent left T3 extended rotated and side bent right inhaled third rib T5 extended rotated and side bent left L2 flexed rotated and side bent right Sacrum right on right    Impression and Recommendations:     This case required medical decision making of moderate complexity. The above documentation has been reviewed and is accurate and complete [name], DO       Note: This dictation was prepared with Dragon dictation along with smaller phrase technology. Any transcriptional errors that result from this process are unintentional.

## 2018-07-30 ENCOUNTER — Ambulatory Visit (INDEPENDENT_AMBULATORY_CARE_PROVIDER_SITE_OTHER): Payer: Commercial Managed Care - PPO | Admitting: Family Medicine

## 2018-07-30 DIAGNOSIS — M999 Biomechanical lesion, unspecified: Secondary | ICD-10-CM | POA: Diagnosis not present

## 2018-07-30 DIAGNOSIS — M5416 Radiculopathy, lumbar region: Secondary | ICD-10-CM | POA: Diagnosis not present

## 2018-07-30 DIAGNOSIS — M1611 Unilateral primary osteoarthritis, right hip: Secondary | ICD-10-CM | POA: Diagnosis not present

## 2018-07-30 MED ORDER — PREDNISONE 50 MG PO TABS: 50.0000 mg | ORAL_TABLET | Freq: Every day | ORAL | 0 refills | Status: DC

## 2018-07-30 NOTE — Assessment & Plan Note (Signed)
Worsening symptoms with down the left leg.  Patient has known moderate osteoarthritic changes of the lumbar spine.  Positive straight leg test on the left side with more radicular symptoms in the L2-L3 correspondence.  Prednisone given, gabapentin, attempted osteopathic manipulation.  Discussed icing regimen and home exercise.  Follow-up with me again in 4 to 6 weeks

## 2018-07-30 NOTE — Assessment & Plan Note (Signed)
Decision today to treat with OMT was based on Physical Exam  After verbal consent patient was treated with HVLA, ME, FPR techniques in cervical, thoracic, lumbar and sacral areas  Patient tolerated the procedure well with improvement in symptoms  Patient given exercises, stretches and lifestyle modifications  See medications in patient instructions if given  Patient will follow up in 3-4 weeks  

## 2018-07-30 NOTE — Patient Instructions (Signed)
Good to see you  Exercises 3 times a week.  Prednisone daily for 5 days  Gabapentin 200mg  at night Stay active when you can but avoid excessive flexion oof the back  See me again in 3-4 weeks [place]!

## 2018-07-30 NOTE — Assessment & Plan Note (Signed)
Patient is going to try conservative therapy.  Do not want surgical intervention secondary to patient's age

## 2018-08-07 ENCOUNTER — Ambulatory Visit: Payer: Commercial Managed Care - PPO | Admitting: Family Medicine

## 2018-08-19 ENCOUNTER — Other Ambulatory Visit: Payer: Self-pay

## 2018-08-19 MED ORDER — MELOXICAM 15 MG PO TABS: 15.0000 mg | ORAL_TABLET | Freq: Every day | ORAL | 3 refills | Status: DC

## 2018-08-19 NOTE — Progress Notes (Signed)
[name] [address] [place], [place] [address] Phone: [phone] Subjective:   [name], am serving as a scribe for Dr. Hulan Saas.   CC: Back pain and hip pain follow-up  [id number]   Jesse [name] is a 48 y.o. male coming in with complaint of right hip pain.  Patient has had this for quite some time.  Was sent for an MRI and MRI did show severe osteoarthritic changes of the hip.  Patient states that he did see surgeon. Was told he would have to have replacement in 2-5 years.   Patient also has started to have left hip pain. Has radiating pain into left leg. Feels like it coming from his back. Does feel like it is improving. Pain is anterior and travels through quad to lateral gastroc. Also has some pain over lateral hip. Does wake up from pain. Sitting and lying down increases his pain while walking improves his pain.   Update 08/21/2018: Jesse [name] is a 48 y.o. male coming in with complaint of back pain. Patient states that he has not been having pain since last visit following the use of prednisone.  Patient has been doing much better overall.  Not having as much pain.  States that the tingling sensation of the leg is improved but not completely gone.  Denies any weakness.  Feeling like he is making significant progress.      No past medical history on file. No past surgical history on file. Social History   Socioeconomic History  . Marital status: Single    Spouse name: Not on file  . Number of children: Not on file  . Years of education: Not on file  . Highest education level: Not on file  Occupational History  . Not on file  Social Needs  . Financial resource strain: Not on file  . Food insecurity:    Worry: Not on file    Inability: Not on file  . Transportation needs:    Medical: Not on file    Non-medical: Not on file  Tobacco Use  . Smoking status: Not on file  Substance and Sexual Activity  . Alcohol use: Not  on file  . Drug use: Not on file  . Sexual activity: Not on file  Lifestyle  . Physical activity:    Days per week: Not on file    Minutes per session: Not on file  . Stress: Not on file  Relationships  . Social connections:    Talks on phone: Not on file    Gets together: Not on file    Attends religious service: Not on file    Active member of club or organization: Not on file    Attends meetings of clubs or organizations: Not on file    Relationship status: Not on file  Other Topics Concern  . Not on file  Social History Narrative  . Not on file   Not on File No family history on file.   Current Outpatient Medications (Cardiovascular):  .  fenofibrate (TRICOR) 48 MG tablet, Take 200 mg by mouth daily.  Jesse [name]  lisinopril (PRINIVIL,ZESTRIL) 20 MG tablet, Take 20 mg by mouth daily.   Current Outpatient Medications (Analgesics):  .  allopurinol (ZYLOPRIM) 300 MG tablet, Take 300 mg by mouth daily.   Current Outpatient Medications (Other):  .  acyclovir (ZOVIRAX) 400 MG tablet, Take 400 mg by mouth 2 (two) times daily.  Jesse [name]  buPROPion (WELLBUTRIN) 75  MG tablet, Take 300 mg by mouth daily.  Jesse [name]  gabapentin (NEURONTIN) 100 MG capsule, Take 2 capsules (200 mg total) by mouth at bedtime. .  Vitamin D, Ergocalciferol, (DRISDOL) 50000 units CAPS capsule, TAKE 1 CAPSULE EVERY 7 DAYS    Past medical history, social, surgical and family history all reviewed in electronic medical record.  No pertanent information unless stated regarding to the chief complaint.   Review of Systems:  No headache, visual changes, nausea, vomiting, diarrhea, constipation, dizziness, abdominal pain, skin rash, fevers, chills, night sweats, weight loss, swollen lymph nodes, body aches, joint swelling,  chest pain, shortness of breath, mood changes.  Positive muscle aches  Objective  Blood pressure 110/62, pulse 96, height 6' (1.829 m), weight 267 lb (121.1 kg), SpO2 97 %.   General: No apparent distress alert  and oriented x3 mood and affect normal, dressed appropriately.  HEENT: Pupils equal, extraocular movements intact  Respiratory: Patient's speak in full sentences and does not appear short of breath  Cardiovascular: No lower extremity edema, non tender, no erythema  Skin: Warm dry intact with no signs of infection or rash on extremities or on axial skeleton.  Abdomen: Soft nontender  Neuro: Cranial nerves II through XII are intact, neurovascularly intact in all extremities with 2+ DTRs and 2+ pulses.  Lymph: No lymphadenopathy of posterior or anterior cervical chain or axillae bilaterally.  Gait normal with good balance and coordination.  MSK:  tender with mild to moderate range loss of the hips but otherwise fairly unremarkable with full of motion and good stability and symmetric strength and tone of shoulders, elbows, wrist,  knee and ankles bilaterally.  Back exam shows some mild loss of lordosis.  Patient does have positive Faber bilaterally.  Negative straight leg test but patient does state some mild tingling in the anterior calf at 25 degrees of flexion.  5-5 strength of the lower extremities.  Deep tendon reflexes intact does have decreased range of motion in all planes of the right hip  Osteopathic findings T7 extended rotated and side bent left L2 flexed rotated and side bent right Sacrum left on left      Impression and Recommendations:     This case required medical decision making of moderate complexity. The above documentation has been reviewed and is accurate and complete [name], DO       Note: This dictation was prepared with Dragon dictation along with smaller phrase technology. Any transcriptional errors that result from this process are unintentional.

## 2018-08-20 ENCOUNTER — Other Ambulatory Visit: Payer: Self-pay | Admitting: Family Medicine

## 2018-08-20 MED ORDER — MELOXICAM 15 MG PO TABS: 15.0000 mg | ORAL_TABLET | Freq: Every day | ORAL | 0 refills | Status: DC

## 2018-08-20 NOTE — Telephone Encounter (Signed)
Copied from [place] [id number]. Topic: Quick Communication - Rx Refill/Question >> Aug 20, 2018 10:48 AM Margot Ables wrote: Medication: meloxicam (MOBIC) 15 MG tablet - pt is requesting 90 day supply via escript for home delivery from [facility] as it is cheaper for the patient  Has the patient contacted their pharmacy? Yes - [facility] (with phone number or street name): [place]. [place], [place] [phone] (Phone) [phone] (Fax)

## 2018-08-21 ENCOUNTER — Encounter: Payer: Self-pay | Admitting: Family Medicine

## 2018-08-21 ENCOUNTER — Ambulatory Visit (INDEPENDENT_AMBULATORY_CARE_PROVIDER_SITE_OTHER): Payer: Commercial Managed Care - PPO | Admitting: Family Medicine

## 2018-08-21 VITALS — BP 110/62 | HR 96 | Ht 72.0 in | Wt 267.0 lb

## 2018-08-21 DIAGNOSIS — M5416 Radiculopathy, lumbar region: Secondary | ICD-10-CM

## 2018-08-21 DIAGNOSIS — M999 Biomechanical lesion, unspecified: Secondary | ICD-10-CM | POA: Diagnosis not present

## 2018-08-21 DIAGNOSIS — M1611 Unilateral primary osteoarthritis, right hip: Secondary | ICD-10-CM

## 2018-08-21 NOTE — Patient Instructions (Signed)
I am so hap[py you are doing well  Stay active More isometric core strengthening  Try to limit lifting motions in one plan and avoid a ton of rotation for now.  Ice after activity  Look up functional exercises as well  See me again in 5 weeks

## 2018-08-21 NOTE — Assessment & Plan Note (Signed)
Significant improvement at this time.  Patient did very well with the prednisone previously.  Restarted osteopathic manipulation.  Encourage patient to continue the gabapentin at night as well as the vitamin D.  Patient given suggestions of more of isometric core strengthening as well as some functional exercises.  Patient will follow-up with me again in 5 weeks

## 2018-08-21 NOTE — Assessment & Plan Note (Signed)
Stable.  Patient will hold off on any type of surgical intervention.

## 2018-08-21 NOTE — Assessment & Plan Note (Signed)
Decision today to treat with OMT was based on Physical Exam  After verbal consent patient was treated with HVLA, ME, FPR techniques in  thoracic, lumbar and sacral areas  Patient tolerated the procedure well with improvement in symptoms  Patient given exercises, stretches and lifestyle modifications  See medications in patient instructions if given  Patient will follow up in 4-5 weeks 

## 2018-09-24 ENCOUNTER — Ambulatory Visit (INDEPENDENT_AMBULATORY_CARE_PROVIDER_SITE_OTHER): Payer: Commercial Managed Care - PPO | Admitting: Family Medicine

## 2018-09-24 VITALS — BP 116/70 | HR 86 | Ht 72.0 in | Wt 299.0 lb

## 2018-09-24 DIAGNOSIS — M999 Biomechanical lesion, unspecified: Secondary | ICD-10-CM | POA: Diagnosis not present

## 2018-09-24 DIAGNOSIS — M1611 Unilateral primary osteoarthritis, right hip: Secondary | ICD-10-CM | POA: Diagnosis not present

## 2018-09-24 DIAGNOSIS — M5416 Radiculopathy, lumbar region: Secondary | ICD-10-CM

## 2018-09-24 NOTE — Assessment & Plan Note (Signed)
Stable.  No other significant changes at this time.

## 2018-09-24 NOTE — Patient Instructions (Signed)
Good to see you  Overall making progress which I am happy about.  Lets keep doing the same See me again in 5-6 weeks

## 2018-09-24 NOTE — Assessment & Plan Note (Signed)
Patient is no longer having any radicular symptoms at the moment.  Has been doing relatively well.  Patient responded well to manipulation and hopefully will do it again.  Encourage patient to continue to work on core strength, home exercises, we discussed the importance of weight loss.  Follow-up with me again 6 weeks

## 2018-09-24 NOTE — Progress Notes (Signed)
[name] [address] [place], [place] [address] Phone: [phone] Subjective:      CC: Back and hip pain follow-up  [id number]   07/30/2018: Significant improvement at this time.  Patient did very well with the prednisone previously.  Restarted osteopathic manipulation.  Encourage patient to continue the gabapentin at night as well as the vitamin D.  Patient given suggestions of more of isometric core strengthening as well as some functional exercises.  Patient will follow-up with me again in 5 weeks  Update 09/24/2018: Jesse [name] is a 48 y.o. male coming in with complaint of back and hip pain. Did develop some numbness in the right upper thigh. Has had occasional burning in left lumbar spine. Unsure as to why he has flare ups. Is using gabapentin and Vitamin D at night.      No past medical history on file. No past surgical history on file. Social History   Socioeconomic History  . Marital status: Single    Spouse name: Not on file  . Number of children: Not on file  . Years of education: Not on file  . Highest education level: Not on file  Occupational History  . Not on file  Social Needs  . Financial resource strain: Not on file  . Food insecurity:    Worry: Not on file    Inability: Not on file  . Transportation needs:    Medical: Not on file    Non-medical: Not on file  Tobacco Use  . Smoking status: Not on file  Substance and Sexual Activity  . Alcohol use: Not on file  . Drug use: Not on file  . Sexual activity: Not on file  Lifestyle  . Physical activity:    Days per week: Not on file    Minutes per session: Not on file  . Stress: Not on file  Relationships  . Social connections:    Talks on phone: Not on file    Gets together: Not on file    Attends religious service: Not on file    Active member of club or organization: Not on file    Attends meetings of clubs or organizations: Not on file    Relationship status:  Not on file  Other Topics Concern  . Not on file  Social History Narrative  . Not on file   Not on File No family history on file.   Current Outpatient Medications (Cardiovascular):  .  fenofibrate (TRICOR) 48 MG tablet, Take 200 mg by mouth daily.  [name]  lisinopril (PRINIVIL,ZESTRIL) 20 MG tablet, Take 20 mg by mouth daily.   Current Outpatient Medications (Analgesics):  .  allopurinol (ZYLOPRIM) 300 MG tablet, Take 300 mg by mouth daily.   Current Outpatient Medications (Other):  .  acyclovir (ZOVIRAX) 400 MG tablet, Take 400 mg by mouth 2 (two) times daily.  [name]  buPROPion (WELLBUTRIN) 75 MG tablet, Take 300 mg by mouth daily.  [name]  gabapentin (NEURONTIN) 100 MG capsule, Take 2 capsules (200 mg total) by mouth at bedtime. .  Vitamin D, Ergocalciferol, (DRISDOL) 50000 units CAPS capsule, TAKE 1 CAPSULE EVERY 7 DAYS    Past medical history, social, surgical and family history all reviewed in electronic medical record.  No pertanent information unless stated regarding to the chief complaint.   Review of Systems:  No headache, visual changes, nausea, vomiting, diarrhea, constipation, dizziness, abdominal pain, skin rash, fevers, chills, night sweats, weight loss, swollen lymph nodes, body  aches, joint swelling, muscle aches, chest pain, shortness of breath, mood changes.   Objective  Blood pressure 116/70, pulse 86, height 6' (1.829 m), weight 299 lb (135.6 kg), SpO2 97 %. Systems examined below as of    General: No apparent distress alert and oriented x3 mood and affect normal, dressed appropriately.  HEENT: Pupils equal, extraocular movements intact  Respiratory: Patient's speak in full sentences and does not appear short of breath  Cardiovascular: No lower extremity edema, non tender, no erythema  Skin: Warm dry intact with no signs of infection or rash on extremities or on axial skeleton.  Abdomen: Soft nontender  Neuro: Cranial nerves II through XII are intact, neurovascularly  intact in all extremities with 2+ DTRs and 2+ pulses.  Lymph: No lymphadenopathy of posterior or anterior cervical chain or axillae bilaterally.  Gait mild antalgic MSK:  Non tender with full range of motion and good stability and symmetric strength and tone of shoulders, elbows, wrist, knee and ankles bilaterally.  Back Exam:  Inspection: Unremarkable  Motion: Flexion 35 deg, Extension 25 deg, Side Bending to 35 deg bilaterally,  Rotation to 35 deg bilaterally  SLR laying: Negative  XSLR laying: Negative  Palpable tenderness: To palpation of the paraspinal musculature.[name] FABER: Positive Faber. Sensory change: Gross sensation intact to all lumbar and sacral dermatomes.  Reflexes: 2+ at both patellar tendons, 2+ at achilles tendons, Babinski's downgoing.  Strength at foot  Plantar-flexion: 5/5 Dorsi-flexion: 5/5 Eversion: 5/5 Inversion: 5/5  Leg strength  Quad: 5/5 Hamstring: 5/5 Hip flexor: 5/5 Hip abductors: 5/5  Patient's right hip does have decreased range of motion in all planes.  Osteopathic findings  T3 extended rotated and side bent right inhaled third rib T5 extended rotated and side bent left L3 flexed rotated and side bent left Sacrum right on right     Impression and Recommendations:     This case required medical decision making of moderate complexity. The above documentation has been reviewed and is accurate and complete [name], DO       Note: This dictation was prepared with Dragon dictation along with smaller phrase technology. Any transcriptional errors that result from this process are unintentional.

## 2018-10-23 ENCOUNTER — Ambulatory Visit: Payer: Commercial Managed Care - PPO | Admitting: Family Medicine

## 2018-10-31 ENCOUNTER — Other Ambulatory Visit: Payer: Self-pay | Admitting: Family Medicine

## 2018-11-10 NOTE — Progress Notes (Signed)
[name] [place], Jesse [name] Phone: [phone] Subjective:   I Jesse [name] am serving as a Education administrator for Dr. Hulan Saas.   CC: Back pain follow-up  [id number]  Jesse [name] is a 48 y.o. male coming in with complaint of back pain. States that he has had some numbness in his right leg.  Known arthritic changes of the hip as well as patient having lumbar radiculopathy.  Has been taking the gabapentin fairly regularly.  Still has some mild discomfort and pain.  Nothing quite as severe though is what he is had previously.  Patient does states that it had not been working as much and is working again.  Since he is started working on a more regular basis noticing more discomfort as well.  Denies bad enough to stop him from any daily activities though.     No past medical history on file. No past surgical history on file. Social History   Socioeconomic History  . Marital status: Single    Spouse name: Not on file  . Number of children: Not on file  . Years of education: Not on file  . Highest education level: Not on file  Occupational History  . Not on file  Social Needs  . Financial resource strain: Not on file  . Food insecurity:    Worry: Not on file    Inability: Not on file  . Transportation needs:    Medical: Not on file    Non-medical: Not on file  Tobacco Use  . Smoking status: Not on file  Substance and Sexual Activity  . Alcohol use: Not on file  . Drug use: Not on file  . Sexual activity: Not on file  Lifestyle  . Physical activity:    Days per week: Not on file    Minutes per session: Not on file  . Stress: Not on file  Relationships  . Social connections:    Talks on phone: Not on file    Gets together: Not on file    Attends religious service: Not on file    Active member of club or organization: Not on file    Attends meetings of clubs or organizations: Not on file    Relationship status: Not on  file  Other Topics Concern  . Not on file  Social History Narrative  . Not on file   Not on File No family history on file.   Current Outpatient Medications (Cardiovascular):  .  fenofibrate (TRICOR) 48 MG tablet, Take 200 mg by mouth daily.  [name]  lisinopril (PRINIVIL,ZESTRIL) 20 MG tablet, Take 20 mg by mouth daily.   Current Outpatient Medications (Analgesics):  .  allopurinol (ZYLOPRIM) 300 MG tablet, Take 300 mg by mouth daily. .  meloxicam (MOBIC) 15 MG tablet, TAKE 1 TABLET DAILY   Current Outpatient Medications (Other):  .  acyclovir (ZOVIRAX) 400 MG tablet, Take 400 mg by mouth 2 (two) times daily.  [name]  buPROPion (WELLBUTRIN) 75 MG tablet, Take 300 mg by mouth daily.  [name]  gabapentin (NEURONTIN) 100 MG capsule, Take 2 capsules (200 mg total) by mouth at bedtime. .  Vitamin D, Ergocalciferol, (DRISDOL) 50000 units CAPS capsule, TAKE 1 CAPSULE EVERY 7 DAYS    Past medical history, social, surgical and family history all reviewed in electronic medical record.  No pertanent information unless stated regarding to the chief complaint.   Review of Systems:  No headache, visual changes, nausea, vomiting,  diarrhea, constipation, dizziness, abdominal pain, skin rash, fevers, chills, night sweats, weight loss, swollen lymph nodes, body aches, joint swelling,  chest pain, shortness of breath, mood changes.  Positive muscle aches  Objective  Blood pressure 140/90, pulse 68, height 6' (1.829 m), weight (!) 308 lb (139.7 kg), SpO2 98 %.   General: No apparent distress alert and oriented x3 mood and affect normal, dressed appropriately.  HEENT: Pupils equal, extraocular movements intact  Respiratory: Patient's speak in full sentences and does not appear short of breath  Cardiovascular: No lower extremity edema, non tender, no erythema  Skin: Warm dry intact with no signs of infection or rash on extremities or on axial skeleton.  Abdomen: Soft nontender  Neuro: Cranial nerves II through  XII are intact, neurovascularly intact in all extremities with 2+ DTRs and 2+ pulses.  Lymph: No lymphadenopathy of posterior or anterior cervical chain or axillae bilaterally.  Gait normal with good balance and coordination.  MSK:  Non tender with full range of motion and good stability and symmetric strength and tone of shoulders, elbows, wrist, hip, knee and ankles bilaterally.  Back Exam:  Inspection: Loss of lordosis poor core strength Motion: Flexion 35 deg, Extension 35 deg, Side Bending to 35 deg bilaterally,  Rotation to 45 deg bilaterally  SLR laying: Negative  XSLR laying: Negative  Palpable tenderness: Diffusely tender in the paraspinal musculature of the lumbar spine. FABER: Redness bilaterally and does have difficulty with range of motion of the right hip. Sensory change: Gross sensation intact to all lumbar and sacral dermatomes.  Reflexes: 2+ at both patellar tendons, 2+ at achilles tendons, Babinski's downgoing.  Strength at foot  Plantar-flexion: 5/5 Dorsi-flexion: 5/5 Eversion: 5/5 Inversion: 5/5  Leg strength  Quad: 5/5 Hamstring: 5/5 Hip flexor: 5/5 Hip abductors: 5/5  Gait unremarkable.  Osteopathic findings  T9 extended rotated and side bent left L2 flexed rotated and side bent right Sacrum right on right    Impression and Recommendations:     This case required medical decision making of moderate complexity. The above documentation has been reviewed and is accurate and complete [name], DO       Note: This dictation was prepared with Dragon dictation along with smaller phrase technology. Any transcriptional errors that result from this process are unintentional.

## 2018-11-12 ENCOUNTER — Ambulatory Visit (INDEPENDENT_AMBULATORY_CARE_PROVIDER_SITE_OTHER): Payer: Commercial Managed Care - PPO | Admitting: Family Medicine

## 2018-11-12 ENCOUNTER — Other Ambulatory Visit: Payer: Self-pay

## 2018-11-12 VITALS — BP 140/90 | HR 68 | Ht 72.0 in | Wt 308.0 lb

## 2018-11-12 DIAGNOSIS — M999 Biomechanical lesion, unspecified: Secondary | ICD-10-CM

## 2018-11-12 DIAGNOSIS — M5416 Radiculopathy, lumbar region: Secondary | ICD-10-CM | POA: Diagnosis not present

## 2018-11-12 NOTE — Patient Instructions (Signed)
Good to see you  Keep it up  See me again in 4 weeks

## 2018-11-12 NOTE — Assessment & Plan Note (Signed)
Lumbar radiculopathy.  Discussed with patient in great length.  Doing relatively well overall.  Discussed gabapentin.  We discussed starting at home exercise.  New handout given to him.  Discussed weight loss and core strengthening.  Follow-up again 4 weeks

## 2018-11-12 NOTE — Assessment & Plan Note (Signed)
Decision today to treat with OMT was based on Physical Exam  After verbal consent patient was treated with HVLA, ME, FPR techniques in  thoracic, lumbar and sacral areas  Patient tolerated the procedure well with improvement in symptoms  Patient given exercises, stretches and lifestyle modifications  See medications in patient instructions if given  Patient will follow up in 4-8 weeks 

## 2018-12-09 NOTE — Progress Notes (Signed)
[name] [place], [place] [address] Phone: [phone] Subjective:   I Jesse [name] am serving as a Education administrator for Dr. Hulan Saas.   CC: Low back pain w/ intermittent R LE numbness  [id number]    11/12/18: Lumbar radiculopathy.  Discussed with patient in great length.  Doing relatively well overall.  Discussed gabapentin.  We discussed starting at home exercise.  New handout given to him.  Discussed weight loss and core strengthening.  Follow-up again 4 weeks  Jesse [name] is a 48 y.o. male coming in with complaint of low back pain w/ intermittent R LE numbness in the R thigh.  Pt states that he is feeling "pretty well" with only intermittent issues w/ his R LE.  He reports mild aching pain w/ some intermittent burning pain in his lower back.  He con't to take gabapentin and has responded well to OMT.  States that he feels ok. No worse than last visit. Patient has known arthritic changes with some degenerative disc disease of the lumbar spine as well as of the hip.  Patient is otherwise doing fairly well.  Trying to continue to work out.  Seems to be doing relatively well overall     No past medical history on file. No past surgical history on file. Social History   Socioeconomic History  . Marital status: Single    Spouse name: Not on file  . Number of children: Not on file  . Years of education: Not on file  . Highest education level: Not on file  Occupational History  . Not on file  Social Needs  . Financial resource strain: Not on file  . Food insecurity:    Worry: Not on file    Inability: Not on file  . Transportation needs:    Medical: Not on file    Non-medical: Not on file  Tobacco Use  . Smoking status: Not on file  Substance and Sexual Activity  . Alcohol use: Not on file  . Drug use: Not on file  . Sexual activity: Not on file  Lifestyle  . Physical activity:    Days per week: Not on file    Minutes per  session: Not on file  . Stress: Not on file  Relationships  . Social connections:    Talks on phone: Not on file    Gets together: Not on file    Attends religious service: Not on file    Active member of club or organization: Not on file    Attends meetings of clubs or organizations: Not on file    Relationship status: Not on file  Other Topics Concern  . Not on file  Social History Narrative  . Not on file   Not on File No family history on file.   Current Outpatient Medications (Cardiovascular):  .  fenofibrate (TRICOR) 48 MG tablet, Take 200 mg by mouth daily.  [name]  lisinopril (PRINIVIL,ZESTRIL) 20 MG tablet, Take 20 mg by mouth daily.   Current Outpatient Medications (Analgesics):  .  allopurinol (ZYLOPRIM) 300 MG tablet, Take 300 mg by mouth daily. .  meloxicam (MOBIC) 15 MG tablet, TAKE 1 TABLET DAILY   Current Outpatient Medications (Other):  .  acyclovir (ZOVIRAX) 400 MG tablet, Take 400 mg by mouth 2 (two) times daily.  [name]  buPROPion (WELLBUTRIN) 75 MG tablet, Take 300 mg by mouth daily.  [name]  gabapentin (NEURONTIN) 100 MG capsule, Take 2 capsules (200 mg  total) by mouth at bedtime. .  Vitamin D, Ergocalciferol, (DRISDOL) 50000 units CAPS capsule, TAKE 1 CAPSULE EVERY 7 DAYS    Past medical history, social, surgical and family history all reviewed in electronic medical record.  No pertanent information unless stated regarding to the chief complaint.   Review of Systems:  No headache, visual changes, nausea, vomiting, diarrhea, constipation, dizziness, abdominal pain, skin rash, fevers, chills, night sweats, weight loss, swollen lymph nodes, body aches, joint swelling, chest pain, shortness of breath, mood changes.  Positive muscle aches  Objective  Blood pressure (!) 152/84, pulse 76, height 6' (1.829 m), weight (!) 305 lb (138.3 kg), SpO2 95 %.    General: No apparent distress alert and oriented x3 mood and affect normal, dressed appropriately.  HEENT: Pupils  equal, extraocular movements intact  Respiratory: Patient's speak in full sentences and does not appear short of breath  Cardiovascular: No lower extremity edema, non tender, no erythema  Skin: Warm dry intact with no signs of infection or rash on extremities or on axial skeleton.  Abdomen: Soft nontender  Neuro: Cranial nerves II through XII are intact, neurovascularly intact in all extremities with 2+ DTRs and 2+ pulses.  Lymph: No lymphadenopathy of posterior or anterior cervical chain or axillae bilaterally.  Gait normal with good balance and coordination.  MSK:  Non tender with full range of motion and good stability and symmetric strength and tone of shoulders, elbows, wrist,  knee and ankles bilaterally.  Back Exam:  Inspection: Loss of lordosis Motion: Flexion 35 deg, Extension 25 deg, Side Bending to 35 deg bilaterally,  Rotation to 35 deg bilaterally  SLR laying: Negative  XSLR laying: Negative  Palpable tenderness: Tender to palpation paraspinal musculature lumbar spine right greater than left. FABER: Severe tightness of the right hip secondary to loss of motion and arthritis. Sensory change: Gross sensation intact to all lumbar and sacral dermatomes.  Reflexes: 2+ at both patellar tendons, 2+ at achilles tendons, Babinski's downgoing.  Strength at foot  Plantar-flexion: 5/5 Dorsi-flexion: 5/5 Eversion: 5/5 Inversion: 5/5  Leg strength  Quad: 5/5 Hamstring: 5/5 Hip flexor: 5/5 Hip abductors: 5/5    Osteopathic findings  T9 extended rotated and side bent left L2 flexed rotated and side bent right Sacrum right on right     Impression and Recommendations:     This case required medical decision making of moderate complexity. The above documentation has been reviewed and is accurate and complete [name], DO       Note: This dictation was prepared with Dragon dictation along with smaller phrase technology. Any transcriptional errors that result from this  process are unintentional.

## 2018-12-10 ENCOUNTER — Ambulatory Visit (INDEPENDENT_AMBULATORY_CARE_PROVIDER_SITE_OTHER): Payer: Commercial Managed Care - PPO | Admitting: Family Medicine

## 2018-12-10 ENCOUNTER — Other Ambulatory Visit: Payer: Self-pay

## 2018-12-10 DIAGNOSIS — M999 Biomechanical lesion, unspecified: Secondary | ICD-10-CM | POA: Diagnosis not present

## 2018-12-10 DIAGNOSIS — M5416 Radiculopathy, lumbar region: Secondary | ICD-10-CM | POA: Diagnosis not present

## 2018-12-10 NOTE — Patient Instructions (Signed)
Great to see you  Alvera Singh is your friend Stay active See me again in 4 weeks

## 2018-12-11 ENCOUNTER — Encounter: Payer: Self-pay | Admitting: Family Medicine

## 2018-12-11 NOTE — Assessment & Plan Note (Signed)
Patient has radicular symptoms.  Still not doing much better than this.  Patient is doing relatively well with conservative therapy.  Discussed which activities of doing which was to avoid.  Patient is to increase activity slowly over the course of next several days.  Patient will follow-up with me again in 4 to 8 weeks

## 2018-12-11 NOTE — Assessment & Plan Note (Signed)
Decision today to treat with OMT was based on Physical Exam  After verbal consent patient was treated with HVLA, ME, FPR techniques in  thoracic, lumbar and sacral areas  Patient tolerated the procedure well with improvement in symptoms  Patient given exercises, stretches and lifestyle modifications  See medications in patient instructions if given  Patient will follow up in 4-8 weeks 

## 2019-01-07 ENCOUNTER — Ambulatory Visit (INDEPENDENT_AMBULATORY_CARE_PROVIDER_SITE_OTHER): Payer: Commercial Managed Care - PPO | Admitting: Family Medicine

## 2019-01-07 ENCOUNTER — Encounter: Payer: Self-pay | Admitting: Family Medicine

## 2019-01-07 ENCOUNTER — Other Ambulatory Visit: Payer: Self-pay

## 2019-01-07 VITALS — BP 120/78 | HR 66 | Ht 72.0 in

## 2019-01-07 DIAGNOSIS — M1611 Unilateral primary osteoarthritis, right hip: Secondary | ICD-10-CM

## 2019-01-07 DIAGNOSIS — M999 Biomechanical lesion, unspecified: Secondary | ICD-10-CM | POA: Diagnosis not present

## 2019-01-07 NOTE — Assessment & Plan Note (Signed)
Patient is compensating for the right hand from time to time.  Does respond well to osteopathic manipulation.  Patient did have manipulation done again and doing very well.  No change in the medications at the moment.  Encourage weight loss.  Patient will consider more aggressive measures with this in the future.  Follow-up with me again in 6 weeks

## 2019-01-07 NOTE — Patient Instructions (Signed)
Doing great overall.  See me again in 6 weeks

## 2019-01-07 NOTE — Progress Notes (Signed)
[name] [address] [place], [place] [address] Phone: [phone] Subjective:   [name], am serving as a scribe for Dr. Hulan Saas.   CC: Back and hip pain follow-up  [id number]  Jesse [name] is a 48 y.o. male coming in with complaint of back and right hip pain. Pain in hip is intermittent. Worse with walking or standing. Patient is here for OMT.  Patient stated the hip is fairly severe but never stops him from activity.  Standing for long amount of time seems to be more pain.      No past medical history on file. No past surgical history on file. Social History   Socioeconomic History  . Marital status: Single    Spouse name: Not on file  . Number of children: Not on file  . Years of education: Not on file  . Highest education level: Not on file  Occupational History  . Not on file  Social Needs  . Financial resource strain: Not on file  . Food insecurity    Worry: Not on file    Inability: Not on file  . Transportation needs    Medical: Not on file    Non-medical: Not on file  Tobacco Use  . Smoking status: Not on file  Substance and Sexual Activity  . Alcohol use: Not on file  . Drug use: Not on file  . Sexual activity: Not on file  Lifestyle  . Physical activity    Days per week: Not on file    Minutes per session: Not on file  . Stress: Not on file  Relationships  . Social Herbalist on phone: Not on file    Gets together: Not on file    Attends religious service: Not on file    Active member of club or organization: Not on file    Attends meetings of clubs or organizations: Not on file    Relationship status: Not on file  Other Topics Concern  . Not on file  Social History Narrative  . Not on file   Not on File No family history on file.  No family history of autoimmune   Current Outpatient Medications (Cardiovascular):  .  fenofibrate (TRICOR) 48 MG tablet, Take 200 mg by mouth daily.   [name]  lisinopril (PRINIVIL,ZESTRIL) 20 MG tablet, Take 20 mg by mouth daily.   Current Outpatient Medications (Analgesics):  .  allopurinol (ZYLOPRIM) 300 MG tablet, Take 300 mg by mouth daily. .  meloxicam (MOBIC) 15 MG tablet, TAKE 1 TABLET DAILY   Current Outpatient Medications (Other):  .  acyclovir (ZOVIRAX) 400 MG tablet, Take 400 mg by mouth 2 (two) times daily.  [name]  buPROPion (WELLBUTRIN) 75 MG tablet, Take 300 mg by mouth daily.  [name]  gabapentin (NEURONTIN) 100 MG capsule, Take 2 capsules (200 mg total) by mouth at bedtime. .  Vitamin D, Ergocalciferol, (DRISDOL) 50000 units CAPS capsule, TAKE 1 CAPSULE EVERY 7 DAYS    Past medical history, social, surgical and family history all reviewed in electronic medical record.  No pertanent information unless stated regarding to the chief complaint.   Review of Systems:  No headache, visual changes, nausea, vomiting, diarrhea, constipation, dizziness, abdominal pain, skin rash, fevers, chills, night sweats, weight loss, swollen lymph nodes, body aches, joint swelling, , chest pain, shortness of breath, mood changes.  Positive muscle aches  Objective  Blood pressure 120/78, pulse 66, height 6' (1.829 m),  SpO2 98 %.    General: No apparent distress alert and oriented x3 mood and affect normal, dressed appropriately.  Patient is overweight HEENT: Pupils equal, extraocular movements intact  Respiratory: Patient's speak in full sentences and does not appear short of breath  Cardiovascular: No lower extremity edema, non tender, no erythema  Skin: Warm dry intact with no signs of infection or rash on extremities or on axial skeleton.  Abdomen: Soft nontender obese Neuro: Cranial nerves II through XII are intact, neurovascularly intact in all extremities with 2+ DTRs and 2+ pulses.  Lymph: No lymphadenopathy of posterior or anterior cervical chain or axillae bilaterally.  Gait normal with good balance and coordination.  MSK:  Non tender with  full range of motion and good stability and symmetric strength and tone of shoulders, elbows, wrist,  knee and ankles bilaterally.  Limited internal range of motion with the right hand.  Positive grind test noted as well.  Negative straight leg test today.  Osteopathic findings   T5 extended rotated and side bent left L2 flexed rotated and side bent right Sacrum right on right      Impression and Recommendations:     This case required medical decision making of moderate complexity. The above documentation has been reviewed and is accurate and complete [name], DO       Note: This dictation was prepared with Dragon dictation along with smaller phrase technology. Any transcriptional errors that result from this process are unintentional.

## 2019-02-15 ENCOUNTER — Encounter: Payer: Self-pay | Admitting: Family Medicine

## 2019-02-15 ENCOUNTER — Other Ambulatory Visit: Payer: Self-pay

## 2019-02-15 ENCOUNTER — Ambulatory Visit (INDEPENDENT_AMBULATORY_CARE_PROVIDER_SITE_OTHER): Payer: Commercial Managed Care - PPO | Admitting: Family Medicine

## 2019-02-15 VITALS — BP 148/90 | HR 69 | Ht 72.0 in | Wt 296.0 lb

## 2019-02-15 DIAGNOSIS — M5416 Radiculopathy, lumbar region: Secondary | ICD-10-CM

## 2019-02-15 DIAGNOSIS — M999 Biomechanical lesion, unspecified: Secondary | ICD-10-CM | POA: Diagnosis not present

## 2019-02-15 NOTE — Progress Notes (Signed)
[name] [place], [place] [address] Phone: [phone] Subjective:   I [name] am serving as a Education administrator for Dr. Hulan Saas.   CC: Back pain follow-up  [id number]  Jesse [name] is a 48 y.o. male coming in with complaint of back pain. States that he is doing well. Some instability at times. Patient states some tightness from time to time.  Trying to workout on a more regular basis.  Patient's hip seems to be stable.  He has no subjective residual weakness avoid.     No past medical history on file. No past surgical history on file. Social History   Socioeconomic History  . Marital status: Single    Spouse name: Not on file  . Number of children: Not on file  . Years of education: Not on file  . Highest education level: Not on file  Occupational History  . Not on file  Social Needs  . Financial resource strain: Not on file  . Food insecurity    Worry: Not on file    Inability: Not on file  . Transportation needs    Medical: Not on file    Non-medical: Not on file  Tobacco Use  . Smoking status: Not on file  Substance and Sexual Activity  . Alcohol use: Not on file  . Drug use: Not on file  . Sexual activity: Not on file  Lifestyle  . Physical activity    Days per week: Not on file    Minutes per session: Not on file  . Stress: Not on file  Relationships  . Social Herbalist on phone: Not on file    Gets together: Not on file    Attends religious service: Not on file    Active member of club or organization: Not on file    Attends meetings of clubs or organizations: Not on file    Relationship status: Not on file  Other Topics Concern  . Not on file  Social History Narrative  . Not on file   Not on File No family history on file.   Current Outpatient Medications (Cardiovascular):  .  fenofibrate (TRICOR) 48 MG tablet, Take 200 mg by mouth daily.  [name]  lisinopril (PRINIVIL,ZESTRIL) 20 MG  tablet, Take 20 mg by mouth daily.   Current Outpatient Medications (Analgesics):  .  allopurinol (ZYLOPRIM) 300 MG tablet, Take 300 mg by mouth daily. .  meloxicam (MOBIC) 15 MG tablet, TAKE 1 TABLET DAILY   Current Outpatient Medications (Other):  .  acyclovir (ZOVIRAX) 400 MG tablet, Take 400 mg by mouth 2 (two) times daily.  [name]  buPROPion (WELLBUTRIN) 75 MG tablet, Take 300 mg by mouth daily.  [name]  gabapentin (NEURONTIN) 100 MG capsule, Take 2 capsules (200 mg total) by mouth at bedtime. .  Vitamin D, Ergocalciferol, (DRISDOL) 50000 units CAPS capsule, TAKE 1 CAPSULE EVERY 7 DAYS    Past medical history, social, surgical and family history all reviewed in electronic medical record.  No pertanent information unless stated regarding to the chief complaint.   Review of Systems:  No headache, visual changes, nausea, vomiting, diarrhea, constipation, dizziness, abdominal pain, skin rash, fevers, chills, night sweats, weight loss, swollen lymph nodes, body aches, joint swelling, muscle aches, chest pain, shortness of breath, mood changes.   Objective  There were no vitals taken for this visit. Systems examined below as of    General: No apparent distress alert and  oriented x3 mood and affect normal, dressed appropriately.  HEENT: Pupils equal, extraocular movements intact  Respiratory: Patient's speak in full sentences and does not appear short of breath  Cardiovascular: No lower extremity edema, non tender, no erythema  Skin: Warm dry intact with no signs of infection or rash on extremities or on axial skeleton.  Abdomen: Soft nontender  Neuro: Cranial nerves II through XII are intact, neurovascularly intact in all extremities with 2+ DTRs and 2+ pulses.  Lymph: No lymphadenopathy of posterior or anterior cervical chain or axillae bilaterally.  Gait normal with good balance and coordination.  MSK:  Non tender with full range of motion and good stability and symmetric strength and tone  of shoulders, elbows, wrist, hip, knee and ankles bilaterally.  Back exam has loss of lordosis.  Patient's core strength is minorly improved from previous exam but still weakness with hip abductor strength.  Patient has negative straight leg test.  Positive Faber on the right.  Osteopathic findings  T9 extended rotated and side bent left L4 flexed rotated and side bent right Sacrum right on right     Impression and Recommendations:     This case required medical decision making of moderate complexity. The above documentation has been reviewed and is accurate and complete [name], DO       Note: This dictation was prepared with Dragon dictation along with smaller phrase technology. Any transcriptional errors that result from this process are unintentional.

## 2019-02-15 NOTE — Assessment & Plan Note (Signed)
Decision today to treat with OMT was based on Physical Exam  After verbal consent patient was treated with HVLA, ME, FPR techniques in  thoracic, lumbar and sacral areas  Patient tolerated the procedure well with improvement in symptoms  Patient given exercises, stretches and lifestyle modifications  See medications in patient instructions if given  Patient will follow up in 4-8 weeks 

## 2019-02-15 NOTE — Assessment & Plan Note (Signed)
Back pain stable.  Has been doing relatively well.  I believe that there is more thoracolumbar tightness recently.  I do believe that this is secondary to more the working positioning again in 4 to 8 weeks.

## 2019-02-15 NOTE — Patient Instructions (Signed)
See me again in 6 weeks Watch the home work environment

## 2019-03-29 NOTE — Progress Notes (Signed)
[name] [place], [place] [address] Phone: [phone] Subjective:   I Jesse [name] am serving as a Education administrator for Dr. Hulan Saas.    CC: Back pain and right hip pain follow-up  [id number]  Jesse [name] is a 48 y.o. male coming in with complaint of back pain. Patient states that he is not doing well today. About 3 weeks ago he had a bad flare up that lasted almost 2 weeks.  Patient states Dull, throbbing aching pain.  Patient does not know what exacerbated it.  Was not responding well to the meloxicam given yesterday.  Today.      No past medical history on file. No past surgical history on file. Social History   Socioeconomic History  . Marital status: Single    Spouse name: Not on file  . Number of children: Not on file  . Years of education: Not on file  . Highest education level: Not on file  Occupational History  . Not on file  Social Needs  . Financial resource strain: Not on file  . Food insecurity    Worry: Not on file    Inability: Not on file  . Transportation needs    Medical: Not on file    Non-medical: Not on file  Tobacco Use  . Smoking status: Not on file  Substance and Sexual Activity  . Alcohol use: Not on file  . Drug use: Not on file  . Sexual activity: Not on file  Lifestyle  . Physical activity    Days per week: Not on file    Minutes per session: Not on file  . Stress: Not on file  Relationships  . Social Herbalist on phone: Not on file    Gets together: Not on file    Attends religious service: Not on file    Active member of club or organization: Not on file    Attends meetings of clubs or organizations: Not on file    Relationship status: Not on file  Other Topics Concern  . Not on file  Social History Narrative  . Not on file   Not on File No family history on file.  Current Outpatient Medications (Endocrine & Metabolic):  .  predniSONE (DELTASONE) 50 MG tablet,  Take 1 pill daily  Current Outpatient Medications (Cardiovascular):  .  fenofibrate (TRICOR) 48 MG tablet, Take 200 mg by mouth daily.  [name]  lisinopril (PRINIVIL,ZESTRIL) 20 MG tablet, Take 20 mg by mouth daily.   Current Outpatient Medications (Analgesics):  .  allopurinol (ZYLOPRIM) 300 MG tablet, Take 300 mg by mouth daily. .  meloxicam (MOBIC) 15 MG tablet, TAKE 1 TABLET DAILY   Current Outpatient Medications (Other):  .  acyclovir (ZOVIRAX) 400 MG tablet, Take 400 mg by mouth 2 (two) times daily.  [name]  buPROPion (WELLBUTRIN) 75 MG tablet, Take 300 mg by mouth daily.  [name]  gabapentin (NEURONTIN) 100 MG capsule, Take 2 capsules (200 mg total) by mouth at bedtime. .  Vitamin D, Ergocalciferol, (DRISDOL) 50000 units CAPS capsule, TAKE 1 CAPSULE EVERY 7 DAYS .  tiZANidine (ZANAFLEX) 4 MG capsule, 1 tablet at night    Past medical history, social, surgical and family history all reviewed in electronic medical record.  No pertanent information unless stated regarding to the chief complaint.   Review of Systems:  No headache, visual changes, nausea, vomiting, diarrhea, constipation, dizziness, abdominal pain, skin rash, fevers, chills, night sweats,  weight loss, swollen lymph nodes, body aches, joint swelling, chest pain, shortness of breath, mood changes.  Positive muscle aches  Objective  Blood pressure 130/70, pulse 82, height 6' (1.829 m), weight 289 lb (131.1 kg), SpO2 96 %.    General: No apparent distress alert and oriented x3 mood and affect normal, dressed appropriately.  HEENT: Pupils equal, extraocular movements intact  Respiratory: Patient's speak in full sentences and does not appear short of breath  Cardiovascular: No lower extremity edema, non tender, no erythema  Skin: Warm dry intact with no signs of infection or rash on extremities or on axial skeleton.  Abdomen: Soft nontender  Neuro: Cranial nerves II through XII are intact, neurovascularly intact in all extremities  with 2+ DTRs and 2+ pulses.  Lymph: No lymphadenopathy of posterior or anterior cervical chain or axillae bilaterally.  Gait normal with good balance and coordination.  MSK:  Non tender with full range of motion and good stability and symmetric strength and tone of shoulders, elbows, wrist, hip, knee and ankles bilaterally.  Low back does show some mild loss of lordosis.  Tender to palpation of the paraspinal fascia lumbar spine right greater than left.  Tightness with Corky Sox test.  Decreased range of motion of the right hip which is patient's baseline.  More discomfort though in the paraspinal musculature of the lumbar spine on the right side.  Osteopathic findings  T9 extended rotated and side bent left L4 flexed rotated and side bent right Sacrum right on right     Impression and Recommendations:     This case required medical decision making of moderate complexity. The above documentation has been reviewed and is accurate and complete [name], DO       Note: This dictation was prepared with Dragon dictation along with smaller phrase technology. Any transcriptional errors that result from this process are unintentional.

## 2019-03-30 ENCOUNTER — Ambulatory Visit (INDEPENDENT_AMBULATORY_CARE_PROVIDER_SITE_OTHER): Payer: Commercial Managed Care - PPO | Admitting: Family Medicine

## 2019-03-30 ENCOUNTER — Encounter: Payer: Self-pay | Admitting: Family Medicine

## 2019-03-30 ENCOUNTER — Other Ambulatory Visit: Payer: Self-pay

## 2019-03-30 DIAGNOSIS — M5416 Radiculopathy, lumbar region: Secondary | ICD-10-CM | POA: Diagnosis not present

## 2019-03-30 DIAGNOSIS — M999 Biomechanical lesion, unspecified: Secondary | ICD-10-CM

## 2019-03-30 MED ORDER — TIZANIDINE HCL 4 MG PO CAPS: ORAL_CAPSULE | ORAL | 0 refills | Status: DC

## 2019-03-30 MED ORDER — PREDNISONE 50 MG PO TABS: ORAL_TABLET | ORAL | 0 refills | Status: DC

## 2019-03-30 NOTE — Assessment & Plan Note (Signed)
Decision today to treat with OMT was based on Physical Exam  After verbal consent patient was treated with HVLA, ME, FPR techniques in  thoracic, lumbar and sacral areas  Patient tolerated the procedure well with improvement in symptoms  Patient given exercises, stretches and lifestyle modifications  See medications in patient instructions if given  Patient will follow up in 4-6 weeks 

## 2019-03-30 NOTE — Assessment & Plan Note (Signed)
Increasing radicular symptoms recently.  Prednisone and Zanaflex given for breakthrough pain.  Continue conservative therapy otherwise, encouraging potential weight loss.  Patient will follow-up with me again in 4 to 6 weeks

## 2019-03-30 NOTE — Patient Instructions (Signed)
Prednisone and zanaflex sent in [place] See me again in 4 weeks

## 2019-04-09 ENCOUNTER — Ambulatory Visit (INDEPENDENT_AMBULATORY_CARE_PROVIDER_SITE_OTHER): Payer: Commercial Managed Care - PPO | Admitting: Family Medicine

## 2019-04-09 ENCOUNTER — Other Ambulatory Visit: Payer: Self-pay

## 2019-04-09 ENCOUNTER — Encounter: Payer: Self-pay | Admitting: Family Medicine

## 2019-04-09 VITALS — BP 135/69 | HR 87 | Temp 97.5°F | Ht 72.0 in | Wt 288.2 lb

## 2019-04-09 DIAGNOSIS — I10 Essential (primary) hypertension: Secondary | ICD-10-CM

## 2019-04-09 DIAGNOSIS — E1169 Type 2 diabetes mellitus with other specified complication: Secondary | ICD-10-CM | POA: Diagnosis not present

## 2019-04-09 DIAGNOSIS — E669 Obesity, unspecified: Secondary | ICD-10-CM

## 2019-04-09 MED ORDER — ATORVASTATIN CALCIUM 40 MG PO TABS: 40.0000 mg | ORAL_TABLET | Freq: Every day | ORAL | 3 refills | Status: DC

## 2019-04-09 NOTE — Patient Instructions (Signed)
Keep the diet clean and stay active.  Stay hydrated.  Let us know if you need anything.

## 2019-04-09 NOTE — Progress Notes (Signed)
Chief Complaint  Patient presents with  . New Patient (Initial Visit)    followup on diabetes       New Patient Visit SUBJECTIVE: HPI: Jesse [name] is an 48 y.o.male who is being seen for establishing care.   The patient was previously seen at a private practice.  Patient has a history of diabetes.  He did not tolerate metformin well.  He is currently on the Ozempic 1 mg weekly.  His last A1c was in late July and was 7.9.  Since that time, he has lost 15 pounds.  He is also on Actos 15 mg daily.  He is not on a statin.  Sugar readings have been running in the low 100s.  Diet is improved, has been doing some walking.   Hypertension Patient presents for hypertension follow up. He does monitor home blood pressures. Blood pressures ranging on average from 130's/80's. He is compliant with medications-Prinzide 20-12.5 mg daily. Patient has these side effects of medication: none He is adhering to a healthy diet overall. Exercise: some walking   No Known Allergies  Past Medical History:  Diagnosis Date  . Arthritis   . Diabetes mellitus without complication ([place])   . Gout   . Hyperlipidemia   . Hypertension    Past Surgical History:  Procedure Laterality Date  . HERNIA REPAIR  2019  . KNEE SURGERY Left 2018   Family History  Problem Relation Age of Onset  . Heart disease Father   . Heart disease Paternal Grandmother   . Heart disease Paternal Grandfather    No Known Allergies  Current Outpatient Medications:  .  acyclovir (ZOVIRAX) 400 MG tablet, Take 400 mg by mouth 2 (two) times daily. , Disp: , Rfl:  .  allopurinol (ZYLOPRIM) 300 MG tablet, Take 300 mg by mouth daily., Disp: , Rfl:  .  ALPRAZolam (XANAX) 0.5 MG tablet, Take 0.5 mg by mouth at bedtime as needed for anxiety., Disp: , Rfl:  .  buPROPion (WELLBUTRIN) 75 MG tablet, Take 300 mg by mouth daily. , Disp: , Rfl:  .  fenofibrate (TRICOR) 48 MG tablet, Take 200 mg by mouth daily. , Disp: , Rfl:  .  gabapentin  (NEURONTIN) 100 MG capsule, Take 2 capsules (200 mg total) by mouth at bedtime., Disp: 180 capsule, Rfl: 1 .  lisinopril-hydrochlorothiazide (ZESTORETIC) 20-12.5 MG tablet, Take 1 tablet by mouth daily., Disp: , Rfl:  .  meloxicam (MOBIC) 15 MG tablet, TAKE 1 TABLET DAILY, Disp: 90 tablet, Rfl: 3 .  pioglitazone (ACTOS) 15 MG tablet, Take 15 mg by mouth daily., Disp: , Rfl:  .  Semaglutide (OZEMPIC, 1 MG/DOSE, ), Inject 1 mg into the skin once a week., Disp: , Rfl:  .  Vitamin D, Ergocalciferol, (DRISDOL) 50000 units CAPS capsule, TAKE 1 CAPSULE EVERY 7 DAYS, Disp: 12 capsule, Rfl: 4 .  atorvastatin (LIPITOR) 40 MG tablet, Take 1 tablet (40 mg total) by mouth daily., Disp: 90 tablet, Rfl: 3  ROS Cardiovascular: Denies chest pain  Respiratory: Denies dyspnea   OBJECTIVE: BP 135/69 (BP Location: Left Arm, Patient Position: Sitting, Cuff Size: Large)   Pulse 87   Temp (!) 97.5 F (36.4 C) (Temporal)   Ht 6' (1.829 m)   Wt 288 lb 4 oz (130.7 kg)   SpO2 99%   BMI 39.09 kg/m   Constitutional: -  VS reviewed -  Well developed, well nourished, appears stated age -  No apparent distress  Psychiatric: -  Oriented to person, place,  and time -  Memory intact -  Affect and mood normal -  Fluent conversation, good eye contact -  Judgment and insight age appropriate  Eye: -  Conjunctivae clear, no discharge -  Pupils symmetric, round, reactive to light  Neck: -  No gross swelling, no palpable masses -  Thyroid midline, not enlarged, mobile, no palpable masses  Cardiovascular: -  RRR -  No LE edema  Respiratory: -  Normal respiratory effort, no accessory muscle use, no retraction -  Breath sounds equal, no wheezes, no ronchi, no crackles  Gastrointestinal: -  Bowel sounds normal -  No tenderness, no distention, no guarding, no masses  Skin: -  No significant lesion on inspection -  Warm and dry to palpation   ASSESSMENT/PLAN: Diabetes mellitus type 2 in obese ([facility]) - Plan: Semaglutide  (OZEMPIC, 1 MG/DOSE, [place]), pioglitazone (ACTOS) 15 MG tablet, atorvastatin (LIPITOR) 40 MG tablet  Essential hypertension - Plan: lisinopril-hydrochlorothiazide (ZESTORETIC) 20-12.5 MG tablet  Patient instructed to sign release of records form from his previous PCP. Start statin, continue medications, counseled on diet and exercise.  Let us follow-up in 6 weeks and check his lab work at that time. Continue blood pressure medication. The patient voiced understanding and agreement to the plan.   [place], DO 04/09/19  4:34 PM

## 2019-04-26 NOTE — Progress Notes (Signed)
[name] [address], [place] [address] Phone: [phone] Subjective:    I'm seeing this patient by the request  of:    CC: Low back pain  [id number]    03/30/19: Increasing radicular symptoms recently.  Prednisone and Zanaflex given for breakthrough pain.  Continue conservative therapy otherwise, encouraging potential weight loss.  Patient will follow-up with me again in 4 to 6 weeks.  Update- 04/27/19: Jesse [name] is a 48 y.o. male coming in with complaint of LBP.  Pt states that his low back is feeling pretty good today.  He states that he hasn't had to take the prednisone since his last visit.  He states that his R hip con't to bother him and still has numbness in his R ant. thigh.      Past Medical History:  Diagnosis Date  . Arthritis   . Diabetes mellitus without complication ([place])   . Gout   . Hyperlipidemia   . Hypertension    Past Surgical History:  Procedure Laterality Date  . HERNIA REPAIR  2019  . KNEE SURGERY Left 2018   Social History   Socioeconomic History  . Marital status: Single    Spouse name: Not on file  . Number of children: Not on file  . Years of education: Not on file  . Highest education level: Not on file  Occupational History  . Not on file  Social Needs  . Financial resource strain: Not on file  . Food insecurity    Worry: Not on file    Inability: Not on file  . Transportation needs    Medical: Not on file    Non-medical: Not on file  Tobacco Use  . Smoking status: Never Smoker  . Smokeless tobacco: Never Used  Substance and Sexual Activity  . Alcohol use: Never    Frequency: Never  . Drug use: Never  . Sexual activity: Not on file  Lifestyle  . Physical activity    Days per week: Not on file    Minutes per session: Not on file  . Stress: Not on file  Relationships  . Social Herbalist on phone: Not on file    Gets together: Not on file    Attends religious  service: Not on file    Active member of club or organization: Not on file    Attends meetings of clubs or organizations: Not on file    Relationship status: Not on file  Other Topics Concern  . Not on file  Social History Narrative  . Not on file   No Known Allergies Family History  Problem Relation Age of Onset  . Heart disease Father   . Heart disease Paternal Grandmother   . Heart disease Paternal Grandfather     Current Outpatient Medications (Endocrine & Metabolic):  .  pioglitazone (ACTOS) 15 MG tablet, Take 15 mg by mouth daily. .  Semaglutide (OZEMPIC, 1 MG/DOSE, [place]), Inject 1 mg into the skin once a week.  Current Outpatient Medications (Cardiovascular):  .  atorvastatin (LIPITOR) 40 MG tablet, Take 1 tablet (40 mg total) by mouth daily. .  fenofibrate (TRICOR) 48 MG tablet, Take 200 mg by mouth daily.  [name]  lisinopril-hydrochlorothiazide (ZESTORETIC) 20-12.5 MG tablet, Take 1 tablet by mouth daily.   Current Outpatient Medications (Analgesics):  .  allopurinol (ZYLOPRIM) 300 MG tablet, Take 300 mg by mouth daily. .  meloxicam (MOBIC) 15 MG tablet, TAKE 1 TABLET  DAILY   Current Outpatient Medications (Other):  .  acyclovir (ZOVIRAX) 400 MG tablet, Take 400 mg by mouth 2 (two) times daily.  [name]  ALPRAZolam (XANAX) 0.5 MG tablet, Take 0.5 mg by mouth at bedtime as needed for anxiety. [name]  buPROPion (WELLBUTRIN) 75 MG tablet, Take 300 mg by mouth daily.  [name]  gabapentin (NEURONTIN) 100 MG capsule, Take 2 capsules (200 mg total) by mouth at bedtime. .  Vitamin D, Ergocalciferol, (DRISDOL) 50000 units CAPS capsule, TAKE 1 CAPSULE EVERY 7 DAYS    Past medical history, social, surgical and family history all reviewed in electronic medical record.  No pertanent information unless stated regarding to the chief complaint.   Review of Systems:  No headache, visual changes, nausea, vomiting, diarrhea, constipation, dizziness, abdominal pain, skin rash, fevers, chills, night sweats,  weight loss, swollen lymph nodes, body aches, joint swelling,  chest pain, shortness of breath, mood changes.  Positive muscle aches  Objective  Blood pressure 120/72, pulse 76, height 6' (1.829 m), weight 278 lb (126.1 kg), SpO2 98 %.    General: No apparent distress alert and oriented x3 mood and affect normal, dressed appropriately.  HEENT: Pupils equal, extraocular movements intact  Respiratory: Patient's speak in full sentences and does not appear short of breath  Cardiovascular: No lower extremity edema, non tender, no erythema  Skin: Warm dry intact with no signs of infection or rash on extremities or on axial skeleton.  Abdomen: Soft nontender  Neuro: Cranial nerves II through XII are intact, neurovascularly intact in all extremities with 2+ DTRs and 2+ pulses.  Lymph: No lymphadenopathy of posterior or anterior cervical chain or axillae bilaterally.  Gait normal with good balance and coordination.  MSK:  Non tender with full range of motion and good stability and symmetric strength and tone of shoulders, elbows, wrist knee and ankles bilaterally.  Back Exam:  Inspection: Unremarkable  Motion: Flexion 45 deg, Extension 25 deg, Side Bending to 35 deg bilaterally,  Rotation to 45 deg bilaterally  SLR laying: Negative  XSLR laying: Negative  Palpable tenderness: Tender to palpation diffusely in the paraspinal musculature lumbar spine.  Some mild limited range of motion of the hips bilaterally.[name] FABER: negative. Sensory change: Gross sensation intact to all lumbar and sacral dermatomes.  Reflexes: 2+ at both patellar tendons, 2+ at achilles tendons, Babinski's downgoing.  Strength at foot  Plantar-flexion: 5/5 Dorsi-flexion: 5/5 Eversion: 5/5 Inversion: 5/5  Leg strength  Quad: 5/5 Hamstring: 5/5 Hip flexor: 5/5 Hip abductors: 5/5  Gait unremarkable.  Osteopathic findings  T9 extended rotated and side bent left L2 flexed rotated and side bent right Sacrum right on right     Impression and Recommendations:     This case required medical decision making of moderate complexity. The above documentation has been reviewed and is accurate and complete [name], DO       Note: This dictation was prepared with Dragon dictation along with smaller phrase technology. Any transcriptional errors that result from this process are unintentional.

## 2019-04-27 ENCOUNTER — Other Ambulatory Visit: Payer: Self-pay

## 2019-04-27 ENCOUNTER — Encounter: Payer: Self-pay | Admitting: Family Medicine

## 2019-04-27 ENCOUNTER — Ambulatory Visit (INDEPENDENT_AMBULATORY_CARE_PROVIDER_SITE_OTHER): Payer: Commercial Managed Care - PPO | Admitting: Family Medicine

## 2019-04-27 VITALS — BP 120/72 | HR 76 | Ht 72.0 in | Wt 278.0 lb

## 2019-04-27 DIAGNOSIS — M999 Biomechanical lesion, unspecified: Secondary | ICD-10-CM

## 2019-04-27 DIAGNOSIS — M5416 Radiculopathy, lumbar region: Secondary | ICD-10-CM | POA: Diagnosis not present

## 2019-04-27 NOTE — Assessment & Plan Note (Signed)
Discussed posture and ergonomics, which activities to do which wants to avoid.  Patient continues to respond fairly well to osteopathic manipulation.  4 to 8 weeks.

## 2019-04-27 NOTE — Patient Instructions (Signed)
Keep an eye on the hip.  See me again in 6 weeks.

## 2019-04-27 NOTE — Assessment & Plan Note (Signed)
Decision today to treat with OMT was based on Physical Exam  After verbal consent patient was treated with HVLA, ME, FPR techniques in  thoracic, lumbar and sacral areas  Patient tolerated the procedure well with improvement in symptoms  Patient given exercises, stretches and lifestyle modifications  See medications in patient instructions if given  Patient will follow up in 4-8 weeks 

## 2019-05-03 ENCOUNTER — Encounter: Payer: Self-pay | Admitting: Family Medicine

## 2019-05-03 LAB — HM DIABETES EYE EXAM

## 2019-05-20 ENCOUNTER — Encounter: Payer: Self-pay | Admitting: Family Medicine

## 2019-05-20 DIAGNOSIS — E1169 Type 2 diabetes mellitus with other specified complication: Secondary | ICD-10-CM

## 2019-05-20 NOTE — Telephone Encounter (Signed)
RF request for Ozempic LOV: 04/09/19 f/u [facility] Next ov: 06/08/19 CPE Last written: not prescribed by you.  Pt is requesting a refill. Please advise and fill if appropriate

## 2019-05-21 ENCOUNTER — Encounter: Payer: Commercial Managed Care - PPO | Admitting: Family Medicine

## 2019-05-21 MED ORDER — OZEMPIC (1 MG/DOSE) 2 MG/1.5ML ~~LOC~~ SOPN: 1.0000 mg | PEN_INJECTOR | SUBCUTANEOUS | 1 refills | Status: DC

## 2019-06-07 ENCOUNTER — Other Ambulatory Visit: Payer: Self-pay

## 2019-06-08 ENCOUNTER — Encounter: Payer: Self-pay | Admitting: Family Medicine

## 2019-06-08 ENCOUNTER — Ambulatory Visit (INDEPENDENT_AMBULATORY_CARE_PROVIDER_SITE_OTHER): Payer: Commercial Managed Care - PPO | Admitting: Family Medicine

## 2019-06-08 VITALS — BP 118/60 | HR 75 | Ht 72.0 in | Wt 271.0 lb

## 2019-06-08 VITALS — BP 122/84 | HR 90 | Temp 97.0°F | Ht 72.0 in | Wt 270.0 lb

## 2019-06-08 DIAGNOSIS — Z114 Encounter for screening for human immunodeficiency virus [HIV]: Secondary | ICD-10-CM

## 2019-06-08 DIAGNOSIS — E669 Obesity, unspecified: Secondary | ICD-10-CM

## 2019-06-08 DIAGNOSIS — Z0000 Encounter for general adult medical examination without abnormal findings: Secondary | ICD-10-CM

## 2019-06-08 DIAGNOSIS — Z23 Encounter for immunization: Secondary | ICD-10-CM

## 2019-06-08 DIAGNOSIS — M5416 Radiculopathy, lumbar region: Secondary | ICD-10-CM

## 2019-06-08 DIAGNOSIS — M999 Biomechanical lesion, unspecified: Secondary | ICD-10-CM

## 2019-06-08 DIAGNOSIS — E1169 Type 2 diabetes mellitus with other specified complication: Secondary | ICD-10-CM

## 2019-06-08 LAB — COMPREHENSIVE METABOLIC PANEL
ALT: 18 U/L (ref 0–53)
AST: 22 U/L (ref 0–37)
Albumin: 5 g/dL (ref 3.5–5.2)
Alkaline Phosphatase: 45 U/L (ref 39–117)
BUN: 19 mg/dL (ref 6–23)
CO2: 27 mEq/L (ref 19–32)
Calcium: 9.6 mg/dL (ref 8.4–10.5)
Chloride: 103 mEq/L (ref 96–112)
Creatinine, Ser: 1.08 mg/dL (ref 0.40–1.50)
GFR: 73 mL/min (ref >60.00)
Glucose, Bld: 118 mg/dL — ABNORMAL HIGH (ref 70–99)
Potassium: 4 mEq/L (ref 3.5–5.1)
Sodium: 138 mEq/L (ref 135–145)
Total Bilirubin: 0.6 mg/dL (ref 0.2–1.2)
Total Protein: 7.4 g/dL (ref 6.0–8.3)

## 2019-06-08 LAB — MICROALBUMIN / CREATININE URINE RATIO
Creatinine,U: 72.3 mg/dL
Microalb Creat Ratio: 3.2 mg/g (ref 0.0–30.0)
Microalb, Ur: 2.3 mg/dL — ABNORMAL HIGH (ref 0.0–1.9)

## 2019-06-08 LAB — CBC
HCT: 40.2 % (ref 39.0–52.0)
Hemoglobin: 13.6 g/dL (ref 13.0–17.0)
MCHC: 33.7 g/dL (ref 30.0–36.0)
MCV: 89.6 fl (ref 78.0–100.0)
Platelets: 267 10*3/uL (ref 150.0–400.0)
RBC: 4.49 Mil/uL (ref 4.22–5.81)
RDW: 13.2 % (ref 11.5–15.5)
WBC: 6 10*3/uL (ref 4.0–10.5)

## 2019-06-08 LAB — LDL CHOLESTEROL, DIRECT: Direct LDL: 72 mg/dL

## 2019-06-08 LAB — LIPID PANEL
Cholesterol: 121 mg/dL (ref 0–200)
HDL: 24.1 mg/dL — ABNORMAL LOW (ref >39.00)
NonHDL: 96.98
Total CHOL/HDL Ratio: 5
Triglycerides: 215 mg/dL — ABNORMAL HIGH (ref 0.0–149.0)
VLDL: 43 mg/dL — ABNORMAL HIGH (ref 0.0–40.0)

## 2019-06-08 LAB — HEMOGLOBIN A1C: Hgb A1c MFr Bld: 5.8 % (ref 4.6–6.5)

## 2019-06-08 LAB — VITAMIN D 25 HYDROXY (VIT D DEFICIENCY, FRACTURES): VITD: 55.97 ng/mL (ref 30.00–100.00)

## 2019-06-08 LAB — URIC ACID: Uric Acid, Serum: 4.6 mg/dL (ref 4.0–7.8)

## 2019-06-08 NOTE — Progress Notes (Signed)
[name] Jesse [name], Jesse [name] Phone: [phone] Subjective:   Jesse [name] am serving as a Education administrator for Dr. Hulan Saas.  Jesse'm seeing this patient by the request  of:    CC: Low back pain and hip pain follow-up  [id number]  Jesse [name] is a 48 y.o. male coming in with complaint of back pain. Patient states he is doing well. No new complaints.  Patient has lost 35 pounds and is doing significantly better overall.  Patient has not had a significant amount of pain.  Patient feels like he is making significant improvement with increasing activity as well.  Happy with the result so far.  Not using anti-inflammatories on a regular basis.     Past Medical History:  Diagnosis Date  . Arthritis   . Diabetes mellitus without complication ([place])   . Gout   . Hyperlipidemia   . Hypertension    Past Surgical History:  Procedure Laterality Date  . HERNIA REPAIR  2019  . KNEE SURGERY Left 2018   Social History   Socioeconomic History  . Marital status: Single    Spouse name: Not on file  . Number of children: Not on file  . Years of education: Not on file  . Highest education level: Not on file  Occupational History  . Not on file  Social Needs  . Financial resource strain: Not on file  . Food insecurity    Worry: Not on file    Inability: Not on file  . Transportation needs    Medical: Not on file    Non-medical: Not on file  Tobacco Use  . Smoking status: Never Smoker  . Smokeless tobacco: Never Used  Substance and Sexual Activity  . Alcohol use: Never    Frequency: Never  . Drug use: Never  . Sexual activity: Not on file  Lifestyle  . Physical activity    Days per week: Not on file    Minutes per session: Not on file  . Stress: Not on file  Relationships  . Social Herbalist on phone: Not on file    Gets together: Not on file    Attends religious service: Not on file    Active member of club or  organization: Not on file    Attends meetings of clubs or organizations: Not on file    Relationship status: Not on file  Other Topics Concern  . Not on file  Social History Narrative  . Not on file   No Known Allergies Family History  Problem Relation Age of Onset  . Heart disease Father   . Heart disease Paternal Grandmother   . Heart disease Paternal Grandfather     Current Outpatient Medications (Endocrine & Metabolic):  .  pioglitazone (ACTOS) 15 MG tablet, Take 15 mg by mouth daily. .  Semaglutide, 1 MG/DOSE, (OZEMPIC, 1 MG/DOSE,) 2 MG/1.5ML SOPN, Inject 1 mg into the skin once a week.  Current Outpatient Medications (Cardiovascular):  .  atorvastatin (LIPITOR) 40 MG tablet, Take 1 tablet (40 mg total) by mouth daily. .  fenofibrate micronized (LOFIBRA) 200 MG capsule, Take by mouth daily before breakfast. .  lisinopril-hydrochlorothiazide (ZESTORETIC) 20-12.5 MG tablet, Take 1 tablet by mouth daily.   Current Outpatient Medications (Analgesics):  .  allopurinol (ZYLOPRIM) 300 MG tablet, Take 300 mg by mouth daily. .  meloxicam (MOBIC) 15 MG tablet, TAKE 1 TABLET DAILY   Current Outpatient Medications (  Other):  .  acyclovir (ZOVIRAX) 400 MG tablet, Take 400 mg by mouth 2 (two) times daily.  [name]  ALPRAZolam (XANAX) 0.5 MG tablet, Take 0.5 mg by mouth at bedtime as needed for anxiety. [name]  buPROPion (WELLBUTRIN XL) 300 MG 24 hr tablet, Take 300 mg by mouth daily. [name]  gabapentin (NEURONTIN) 100 MG capsule, Take 2 capsules (200 mg total) by mouth at bedtime. .  Vitamin D, Ergocalciferol, (DRISDOL) 50000 units CAPS capsule, TAKE 1 CAPSULE EVERY 7 DAYS    Past medical history, social, surgical and family history all reviewed in electronic medical record.  No pertanent information unless stated regarding to the chief complaint.   Review of Systems:  No headache, visual changes, nausea, vomiting, diarrhea, constipation, dizziness, abdominal pain, skin rash, fevers, chills, night  sweats, weight loss, swollen lymph nodes, body aches, joint swelling, muscle aches, chest pain, shortness of breath, mood changes.   Objective  Blood pressure 118/60, pulse 75, height 6' (1.829 m), weight 271 lb (122.9 kg), SpO2 97 %. Systems examined below as of    General: No apparent distress alert and oriented x3 mood and affect normal, dressed appropriately.  HEENT: Pupils equal, extraocular movements intact  Respiratory: Patient's speak in full sentences and does not appear short of breath  Cardiovascular: No lower extremity edema, non tender, no erythema  Skin: Warm dry intact with no signs of infection or rash on extremities or on axial skeleton.  Abdomen: Soft nontender  Neuro: Cranial nerves II through XII are intact, neurovascularly intact in all extremities with 2+ DTRs and 2+ pulses.  Lymph: No lymphadenopathy of posterior or anterior cervical chain or axillae bilaterally.  Gait normal with good balance and coordination.  MSK:  Non tender with full range of motion and good stability and symmetric strength and tone of shoulders, elbows, wrist,  knee and ankles bilaterally.  Low back exam shows the patient still has some mild loss of lordosis, patient does have some mild tightness with Corky Sox test and some decrease in internal rotation of the right hip.  Patient does have a negative straight leg test.  Minimal tenderness to palpation of the sacroiliac joints bilaterally.  Limited range of motion lacking the last 5 degrees of extension in the last 2 degrees of sidebending bilaterally.  Osteopathic findings  T7 extended rotated and side bent left L2 flexed rotated and side bent right Sacrum right on right    Impression and Recommendations:     This case required medical decision making of moderate complexity. The above documentation has been reviewed and is accurate and complete Jesse Pulley, DO       Note: This dictation was prepared with Dragon dictation along with  smaller phrase technology. Any transcriptional errors that result from this process are unintentional.

## 2019-06-08 NOTE — Assessment & Plan Note (Signed)
Patient does have some mild radicular symptoms intermittently but not having any pain now.  Has lost weight and is making some provement.  Discussed which activities to do which wants to avoid.  Discussed topical anti-inflammatories.  Follow-up again in 4 to 8 weeks

## 2019-06-08 NOTE — Patient Instructions (Addendum)
Give [place] 2-3 business days to get the results of your labs back.   Keep the diet clean and stay active.  Consider Metamucil to help balance out your stools.  Let us know if you need anything.

## 2019-06-08 NOTE — Assessment & Plan Note (Signed)
Decision today to treat with OMT was based on Physical Exam  After verbal consent patient was treated with HVLA, ME, FPR techniques in  thoracic, lumbar and sacral areas  Patient tolerated the procedure well with improvement in symptoms  Patient given exercises, stretches and lifestyle modifications  See medications in patient instructions if given  Patient will follow up in 4-8 weeks 

## 2019-06-08 NOTE — Patient Instructions (Signed)
Doing good! Happy [place] day See me again in 8 weeks

## 2019-06-08 NOTE — Progress Notes (Signed)
Chief Complaint  Patient presents with  . Annual Exam    Well Male Jesse [name] is here for a complete physical.   His last physical was >1 year ago.  Current diet: in general, a "healthy" diet.   Current exercise: walking Weight trend: intentionally decreasing Daytime fatigue? No. Seat belt? Yes.    Health maintenance Tetanus- Yes HIV- No  Past Medical History:  Diagnosis Date  . Arthritis   . Diabetes mellitus without complication ([place])   . Gout   . Hyperlipidemia   . Hypertension      Past Surgical History:  Procedure Laterality Date  . HERNIA REPAIR  2019  . KNEE SURGERY Left 2018    Medications  Current Outpatient Medications on File Prior to Visit  Medication Sig Dispense Refill  . acyclovir (ZOVIRAX) 400 MG tablet Take 400 mg by mouth 2 (two) times daily.     [name] allopurinol (ZYLOPRIM) 300 MG tablet Take 300 mg by mouth daily.    [name] ALPRAZolam (XANAX) 0.5 MG tablet Take 0.5 mg by mouth at bedtime as needed for anxiety.    [name] atorvastatin (LIPITOR) 40 MG tablet Take 1 tablet (40 mg total) by mouth daily. 90 tablet 3  . buPROPion (WELLBUTRIN XL) 300 MG 24 hr tablet Take 300 mg by mouth daily.    . fenofibrate micronized (LOFIBRA) 200 MG capsule Take by mouth daily before breakfast.    . gabapentin (NEURONTIN) 100 MG capsule Take 2 capsules (200 mg total) by mouth at bedtime. 180 capsule 1  . lisinopril-hydrochlorothiazide (ZESTORETIC) 20-12.5 MG tablet Take 1 tablet by mouth daily.    . meloxicam (MOBIC) 15 MG tablet TAKE 1 TABLET DAILY 90 tablet 3  . Semaglutide, 1 MG/DOSE, (OZEMPIC, 1 MG/DOSE,) 2 MG/1.5ML SOPN Inject 1 mg into the skin once a week. 1.5 mL 1  . Vitamin D, Ergocalciferol, (DRISDOL) 50000 units CAPS capsule TAKE 1 CAPSULE EVERY 7 DAYS 12 capsule 4  . pioglitazone (ACTOS) 15 MG tablet Take 15 mg by mouth daily.      Allergies No Known Allergies  Family History Family History  Problem Relation Age of Onset  . Heart disease Father   . Heart  disease Paternal Grandmother   . Heart disease Paternal Grandfather     Review of Systems: Constitutional: no fevers or chills Eye:  no recent significant change in vision Ear/Nose/Mouth/Throat:  Ears:  no hearing loss Nose/Mouth/Throat:  no complaints of nasal congestion, no sore throat Cardiovascular:  no chest pain Respiratory:  no shortness of breath Gastrointestinal:  no abdominal pain, no change in bowel habits GU:  Male: negative for dysuria, frequency, and incontinence Musculoskeletal/Extremities: +R hip pain; otherwise no pain of the joints Integumentary (Skin/Breast):  no abnormal skin lesions reported Neurologic:  no headaches Endocrine: No unexpected weight changes Hematologic/Lymphatic:  no night sweats  Exam BP 122/84 (BP Location: Left Arm, Patient Position: Sitting, Cuff Size: Large)   Pulse 90   Temp (!) 97 F (36.1 C) (Temporal)   Ht 6' (1.829 m)   Wt 270 lb (122.5 kg)   SpO2 97%   BMI 36.62 kg/m  General:  well developed, well nourished, in no apparent distress Skin:  no significant moles, warts, or growths Head:  no masses, lesions, or tenderness Eyes:  pupils equal and round, sclera anicteric without injection Ears:  canals without lesions, TMs shiny without retraction, no obvious effusion, no erythema Nose:  nares patent, septum midline, mucosa normal Throat/Pharynx:  lips and gingiva without lesion;  tongue and uvula midline; non-inflamed pharynx; no exudates or postnasal drainage Neck: neck supple without adenopathy, thyromegaly, or masses Lungs:  clear to auscultation, breath sounds equal bilaterally, no respiratory distress Cardio:  regular rate and rhythm, no bruits, no LE edema Abdomen:  abdomen soft, nontender; bowel sounds normal; no masses or organomegaly Rectal: Deferred Musculoskeletal:  symmetrical muscle groups noted without atrophy or deformity Extremities:  no clubbing, cyanosis, or edema, no deformities, no skin discoloration Neuro:   gait normal; deep tendon reflexes normal and symmetric; sensation intact to pinprick on both feet Psych: well oriented with normal range of affect and appropriate judgment/insight  Assessment and Plan  Well adult exam - Plan: CBC, Comp Met (CMET), Lipid Profile, Vitamin D (25 hydroxy), Uric acid  Need for influenza vaccination - Plan: Flu Vaccine QUAD 6+ mos PF IM (Fluarix Quad PF)  Screening for HIV (human immunodeficiency virus) - Plan: HIV antibody (with reflex)  Diabetes mellitus type 2 in obese ([place]) - Plan: HgB A1c, Urine Microalbumin w/creat. ratio, HM Diabetes Foot Exam  Need for vaccination against Streptococcus pneumoniae - Plan: Pneumococcal polysaccharide vaccine 23-valent greater than or equal to 2yo subcutaneous/IM   Well 48 y.o. male. Counseled on diet and exercise. Counseled on risks and benefits of prostate cancer screening with PSA. The patient agrees to forego screening.  Last eye exam was 3 weeks ago. Other orders as above. Follow up in 6 mo pending the above workup. The patient voiced understanding and agreement to the plan.  [place], DO 06/08/19 9:53 AM

## 2019-06-09 LAB — HIV ANTIBODY (ROUTINE TESTING W REFLEX): HIV 1&2 Ab, 4th Generation: NONREACTIVE

## 2019-06-10 ENCOUNTER — Other Ambulatory Visit: Payer: Self-pay | Admitting: Family Medicine

## 2019-06-23 ENCOUNTER — Encounter: Payer: Self-pay | Admitting: Family Medicine

## 2019-06-23 DIAGNOSIS — I10 Essential (primary) hypertension: Secondary | ICD-10-CM

## 2019-06-23 MED ORDER — LISINOPRIL-HYDROCHLOROTHIAZIDE 20-12.5 MG PO TABS: 1.0000 | ORAL_TABLET | Freq: Every day | ORAL | 0 refills | Status: DC

## 2019-06-23 MED ORDER — BUPROPION HCL ER (XL) 300 MG PO TB24: 300.0000 mg | ORAL_TABLET | Freq: Every day | ORAL | 0 refills | Status: DC

## 2019-06-23 MED ORDER — ALLOPURINOL 300 MG PO TABS: 300.0000 mg | ORAL_TABLET | Freq: Every day | ORAL | 0 refills | Status: DC

## 2019-06-23 MED ORDER — ACYCLOVIR 400 MG PO TABS: 400.0000 mg | ORAL_TABLET | Freq: Two times a day (BID) | ORAL | 0 refills | Status: DC

## 2019-06-23 MED ORDER — FENOFIBRATE 200 MG PO CAPS: 200.0000 mg | ORAL_CAPSULE | Freq: Every day | ORAL | 0 refills | Status: DC

## 2019-07-09 ENCOUNTER — Other Ambulatory Visit: Payer: Self-pay | Admitting: Family Medicine

## 2019-08-03 ENCOUNTER — Encounter: Payer: Self-pay | Admitting: Family Medicine

## 2019-08-03 ENCOUNTER — Other Ambulatory Visit: Payer: Self-pay

## 2019-08-03 ENCOUNTER — Ambulatory Visit (INDEPENDENT_AMBULATORY_CARE_PROVIDER_SITE_OTHER): Payer: Commercial Managed Care - PPO | Admitting: Family Medicine

## 2019-08-03 VITALS — BP 122/78 | HR 112 | Ht 72.0 in | Wt 282.0 lb

## 2019-08-03 DIAGNOSIS — M999 Biomechanical lesion, unspecified: Secondary | ICD-10-CM

## 2019-08-03 DIAGNOSIS — M1611 Unilateral primary osteoarthritis, right hip: Secondary | ICD-10-CM | POA: Diagnosis not present

## 2019-08-03 NOTE — Patient Instructions (Signed)
Keep it up and continue what you are doing See me again in 6 weeks

## 2019-08-03 NOTE — Progress Notes (Signed)
[place] [employer] [place] Phone: [phone] Subjective:   Jesse [name], am serving as a scribe for Dr. Hulan Saas. This visit occurred during the SARS-CoV-2 public health emergency.  Safety protocols were in place, including screening questions prior to the visit, additional usage of staff PPE, and extensive cleaning of exam room while observing appropriate contact time as indicated for disinfecting solutions.       CC: Low back pain and hip pain follow-up  [id number]  Jesse [name] is a 49 y.o. male coming in with complaint of back pain. Last seen on 06/08/2019 for OMT. Patient states patient is doing relatively well.  Some mild tightness.  Tender to palpation in the paraspinal musculature a little bit.  Patient is continuing to attempt to lose weight      Past Medical History:  Diagnosis Date  . Arthritis   . Diabetes mellitus without complication ([place])   . Gout   . Hyperlipidemia   . Hypertension    Past Surgical History:  Procedure Laterality Date  . HERNIA REPAIR  2019  . KNEE SURGERY Left 2018   Social History   Socioeconomic History  . Marital status: Single    Spouse name: Not on file  . Number of children: Not on file  . Years of education: Not on file  . Highest education level: Not on file  Occupational History  . Not on file  Tobacco Use  . Smoking status: Never Smoker  . Smokeless tobacco: Never Used  Substance and Sexual Activity  . Alcohol use: Never  . Drug use: Never  . Sexual activity: Not on file  Other Topics Concern  . Not on file  Social History Narrative  . Not on file   Social Determinants of Health   Financial Resource Strain:   . Difficulty of Paying Living Expenses: Not on file  Food Insecurity:   . Worried About Charity fundraiser in the Last Year: Not on file  . Ran Out of Food in the Last Year: Not on file  Transportation Needs:   . Lack of Transportation  (Medical): Not on file  . Lack of Transportation (Non-Medical): Not on file  Physical Activity:   . Days of Exercise per Week: Not on file  . Minutes of Exercise per Session: Not on file  Stress:   . Feeling of Stress : Not on file  Social Connections:   . Frequency of Communication with Friends and Family: Not on file  . Frequency of Social Gatherings with Friends and Family: Not on file  . Attends Religious Services: Not on file  . Active Member of Clubs or Organizations: Not on file  . Attends Archivist Meetings: Not on file  . Marital Status: Not on file   [name] Known Allergies Family History  Problem Relation Age of Onset  . Heart disease Father   . Heart disease Paternal Grandmother   . Heart disease Paternal Grandfather     Current Outpatient Medications (Endocrine & Metabolic):  .  Semaglutide, 1 MG/DOSE, (OZEMPIC, 1 MG/DOSE,) 2 MG/1.5ML SOPN, Inject 1 mg into the skin once a week.  Current Outpatient Medications (Cardiovascular):  .  atorvastatin (LIPITOR) 40 MG tablet, Take 1 tablet (40 mg total) by mouth daily. .  fenofibrate micronized (LOFIBRA) 200 MG capsule, Take 1 capsule (200 mg total) by mouth daily before breakfast. .  lisinopril-hydrochlorothiazide (ZESTORETIC) 20-12.5 MG tablet, Take 1 tablet by mouth daily.  Current Outpatient Medications (Analgesics):  .  allopurinol (ZYLOPRIM) 300 MG tablet, Take 1 tablet (300 mg total) by mouth daily. .  meloxicam (MOBIC) 15 MG tablet, TAKE 1 TABLET DAILY   Current Outpatient Medications (Other):  .  acyclovir (ZOVIRAX) 400 MG tablet, Take 1 tablet (400 mg total) by mouth 2 (two) times daily. [name]  ALPRAZolam (XANAX) 0.5 MG tablet, Take 0.5 mg by mouth at bedtime as needed for anxiety. [name]  buPROPion (WELLBUTRIN XL) 300 MG 24 hr tablet, Take 1 tablet (300 mg total) by mouth daily. [name]  gabapentin (NEURONTIN) 100 MG capsule, Take 2 capsules (200 mg total) by mouth at bedtime. .  Vitamin D, Ergocalciferol, (DRISDOL)  1.25 MG (50000 UT) CAPS capsule, TAKE 1 CAPSULE EVERY 7 DAYS    Past medical history, social, surgical and family history all reviewed in electronic medical record.  [name] pertanent information unless stated regarding to the chief complaint.   Review of Systems:  [name] headache, visual changes, nausea, vomiting, diarrhea, constipation, dizziness, abdominal pain, skin rash, fevers, chills, night sweats, weight loss, swollen lymph nodes, body aches, joint swelling, muscle aches, chest pain, shortness of breath, mood changes.   Objective  Blood pressure 122/78, pulse (!) 112, height 6' (1.829 m), weight 282 lb (127.9 kg), SpO2 97 %.    General: [name] apparent distress alert and oriented x3 mood and affect normal, dressed appropriately.  HEENT: Pupils equal, extraocular movements intact  Respiratory: Patient's speak in full sentences and does not appear short of breath  Cardiovascular: [name] lower extremity edema, non tender, [name] erythema  Skin: Warm dry intact with [name] signs of infection or rash on extremities or on axial skeleton.  Abdomen: Soft nontender  Neuro: Cranial nerves II through XII are intact, neurovascularly intact in all extremities with 2+ DTRs and 2+ pulses.  Lymph: [name] lymphadenopathy of posterior or anterior cervical chain or axillae bilaterally.  Gait normal with good balance and coordination.  MSK:  tender with limited range of motion and good stability and symmetric strength and tone of shoulders, elbows, wrist,  knee and ankles bilaterally.  Low back exam does have some loss of lordosis.  Tender to palpation to right greater than left.  Patient does have mild limited range of motion of the hips bilaterally with tightness with Corky Sox test.  Neurovascular intact distally with 5 out of 5 strength   Osteopathic findings  T7 extended rotated and side bent left L2 flexed rotated and side bent right Sacrum right on right    Impression and Recommendations:     This case required  medical decision making of moderate complexity. The above documentation has been reviewed and is accurate and complete Jesse Pulley, DO       Note: This dictation was prepared with Dragon dictation along with smaller phrase technology. Any transcriptional errors that result from this process are unintentional.

## 2019-08-03 NOTE — Assessment & Plan Note (Signed)
Arthritis of the hip as well as some back pain.  Discussed which activities to do which wants to avoid.  Patient does continue to respond fairly well to osteopathic manipulation.  Discussed posture and ergonomics, patient will follow up with me again 4 to 8 weeks

## 2019-08-03 NOTE — Assessment & Plan Note (Signed)
Decision today to treat with OMT was based on Physical Exam  After verbal consent patient was treated with HVLA, ME, FPR techniques in  thoracic, lumbar and sacral areas  Patient tolerated the procedure well with improvement in symptoms  Patient given exercises, stretches and lifestyle modifications  See medications in patient instructions if given  Patient will follow up in 4-8 weeks 

## 2019-09-03 ENCOUNTER — Other Ambulatory Visit: Payer: Self-pay | Admitting: Family Medicine

## 2019-09-03 DIAGNOSIS — I10 Essential (primary) hypertension: Secondary | ICD-10-CM

## 2019-09-06 ENCOUNTER — Other Ambulatory Visit: Payer: Self-pay | Admitting: Family Medicine

## 2019-09-08 NOTE — Telephone Encounter (Signed)
Received PA approval for Ozempic from 08/08/2019 through 09/06/2022. The patient/pharmacy is informed

## 2019-09-14 ENCOUNTER — Ambulatory Visit: Payer: Commercial Managed Care - PPO | Admitting: Family Medicine

## 2019-09-21 ENCOUNTER — Other Ambulatory Visit: Payer: Self-pay | Admitting: Family Medicine

## 2019-09-27 ENCOUNTER — Other Ambulatory Visit: Payer: Self-pay

## 2019-09-27 ENCOUNTER — Ambulatory Visit (INDEPENDENT_AMBULATORY_CARE_PROVIDER_SITE_OTHER): Payer: Commercial Managed Care - PPO | Admitting: Family Medicine

## 2019-09-27 ENCOUNTER — Encounter: Payer: Self-pay | Admitting: Family Medicine

## 2019-09-27 ENCOUNTER — Telehealth: Payer: Self-pay

## 2019-09-27 VITALS — BP 114/74 | HR 91 | Ht 72.0 in | Wt 260.0 lb

## 2019-09-27 DIAGNOSIS — M999 Biomechanical lesion, unspecified: Secondary | ICD-10-CM

## 2019-09-27 DIAGNOSIS — M5416 Radiculopathy, lumbar region: Secondary | ICD-10-CM | POA: Diagnosis not present

## 2019-09-27 MED ORDER — ALPRAZOLAM 0.5 MG PO TABS: 0.5000 mg | ORAL_TABLET | Freq: Every evening | ORAL | 1 refills | Status: DC | PRN

## 2019-09-27 NOTE — Assessment & Plan Note (Signed)
Decision today to treat with OMT was based on Physical Exam  After verbal consent patient was treated with HVLA, ME, FPR techniques in  thoracic, lumbar and sacral areas  Patient tolerated the procedure well with improvement in symptoms  Patient given exercises, stretches and lifestyle modifications  See medications in patient instructions if given  Patient will follow up in 4-8 weeks 

## 2019-09-27 NOTE — Progress Notes (Signed)
[place] Phone: [phone] Subjective:   Jesse [name], am serving as a scribe for Dr. Hulan Saas. This visit occurred during the SARS-CoV-2 public health emergency.  Safety protocols were in place, including screening questions prior to the visit, additional usage of staff PPE, and extensive cleaning of exam room while observing appropriate contact time as indicated for disinfecting solutions.   I'm seeing this patient by the request  of:  [name], DO  CC: Low back pain and hip pain follow-up  [id number]  Jesse [name] is a 49 y.o. male coming in with complaint of back pain. Last seen on 08/03/2019 for OMT. Patient states that he did have right sided lower back spasms that last one week after sneezing. Otherwise [name] new issues since last visit.  Patient states this morning tightness.      Past Medical History:  Diagnosis Date  . Arthritis   . Diabetes mellitus without complication ([place])   . Gout   . Hyperlipidemia   . Hypertension    Past Surgical History:  Procedure Laterality Date  . HERNIA REPAIR  2019  . KNEE SURGERY Left 2018   Social History   Socioeconomic History  . Marital status: Single    Spouse name: Not on file  . Number of children: Not on file  . Years of education: Not on file  . Highest education level: Not on file  Occupational History  . Not on file  Tobacco Use  . Smoking status: Never Smoker  . Smokeless tobacco: Never Used  Substance and Sexual Activity  . Alcohol use: Never  . Drug use: Never  . Sexual activity: Not on file  Other Topics Concern  . Not on file  Social History Narrative  . Not on file   Social Determinants of Health   Financial Resource Strain:   . Difficulty of Paying Living Expenses: Not on file  Food Insecurity:   . Worried About Charity fundraiser in the Last Year: Not on file  . Ran Out of Food in the Last Year: Not  on file  Transportation Needs:   . Lack of Transportation (Medical): Not on file  . Lack of Transportation (Non-Medical): Not on file  Physical Activity:   . Days of Exercise per Week: Not on file  . Minutes of Exercise per Session: Not on file  Stress:   . Feeling of Stress : Not on file  Social Connections:   . Frequency of Communication with Friends and Family: Not on file  . Frequency of Social Gatherings with Friends and Family: Not on file  . Attends Religious Services: Not on file  . Active Member of Clubs or Organizations: Not on file  . Attends Archivist Meetings: Not on file  . Marital Status: Not on file   [name] Known Allergies Family History  Problem Relation Age of Onset  . Heart disease Father   . Heart disease Paternal Grandmother   . Heart disease Paternal Grandfather     Current Outpatient Medications (Endocrine & Metabolic):  .  Semaglutide, 1 MG/DOSE, (OZEMPIC, 1 MG/DOSE,) 2 MG/1.5ML SOPN, Inject 1 mg into the skin once a week.  Current Outpatient Medications (Cardiovascular):  .  atorvastatin (LIPITOR) 40 MG tablet, Take 1 tablet (40 mg total) by mouth daily. .  fenofibrate micronized (LOFIBRA) 200 MG capsule, TAKE 1 CAPSULE DAILY BEFORE BREAKFAST .  lisinopril-hydrochlorothiazide (ZESTORETIC) 20-12.5 MG tablet, TAKE  1 TABLET DAILY   Current Outpatient Medications (Analgesics):  .  allopurinol (ZYLOPRIM) 300 MG tablet, TAKE 1 TABLET DAILY .  meloxicam (MOBIC) 15 MG tablet, TAKE 1 TABLET DAILY   Current Outpatient Medications (Other):  .  acyclovir (ZOVIRAX) 400 MG tablet, TAKE 1 TABLET TWICE A DAY .  buPROPion (WELLBUTRIN XL) 300 MG 24 hr tablet, TAKE 1 TABLET DAILY .  gabapentin (NEURONTIN) 100 MG capsule, TAKE 2 CAPSULES AT BEDTIME .  Vitamin D, Ergocalciferol, (DRISDOL) 1.25 MG (50000 UT) CAPS capsule, TAKE 1 CAPSULE EVERY 7 DAYS .  ALPRAZolam (XANAX) 0.5 MG tablet, Take 1 tablet (0.5 mg total) by mouth at bedtime as needed for  anxiety.   Reviewed prior external information including notes and imaging from  primary care provider As well as notes that were available from care everywhere and other healthcare systems.  Past medical history, social, surgical and family history all reviewed in electronic medical record.  [name] pertanent information unless stated regarding to the chief complaint.   Review of Systems:  [name] headache, visual changes, nausea, vomiting, diarrhea, constipation, dizziness, abdominal pain, skin rash, fevers, chills, night sweats, weight loss, swollen lymph nodes, body aches, joint swelling, chest pain, shortness of breath, mood changes. POSITIVE muscle aches  Objective  Blood pressure 114/74, pulse 91, height 6' (1.829 m), weight 260 lb (117.9 kg), SpO2 97 %.   General: [name] apparent distress alert and oriented x3 mood and affect normal, dressed appropriately.  HEENT: Pupils equal, extraocular movements intact  Respiratory: Patient's speak in full sentences and does not appear short of breath  Cardiovascular: [name] lower extremity edema, non tender, [name] erythema  Skin: Warm dry intact with [name] signs of infection or rash on extremities or on axial skeleton.  Abdomen: Soft nontender  Neuro: Cranial nerves II through XII are intact, neurovascularly intact in all extremities with 2+ DTRs and 2+ pulses.  Lymph: [name] lymphadenopathy of posterior or anterior cervical chain or axillae bilaterally.  Gait normal with good balance and coordination.  MSK:  Non tender with full range of motion and good stability and symmetric strength and tone of shoulders, elbows, wrist, knee and ankles bilaterally.  Low back exam shows the patient does have tenderness to palpation of paraspinal musculature lumbar spine right greater than left.  Patient does have some limited range of motion of the hips now seems to be a little more bilateral.  Lacks the last 5 degrees of extension and side bending.  Osteopathic findings   T9  extended rotated and side bent left L2 flexed rotated and side bent right Sacrum right on right    Impression and Recommendations:     This case required medical decision making of moderate complexity. The above documentation has been reviewed and is accurate and complete [name], DO       Note: This dictation was prepared with Dragon dictation along with smaller phrase technology. Any transcriptional errors that result from this process are unintentional.

## 2019-09-27 NOTE — Assessment & Plan Note (Signed)
Patient does have some radicular symptoms overall.  We discussed which activities to do which wants to avoid.  Patient should increase to be slowly over the course the next several weeks.  Discussed icing regimen and home exercises.  Patient has a chronic problem with known exacerbation.  Follow-up again in 4 to 6 weeks.  Social determinants of health includes patient is able to workout on a regular basis secondary to patient's job not and avoiding interactions secondary to coronavirus outbreak

## 2019-09-27 NOTE — Telephone Encounter (Signed)
Requesting: Alprazolam Contract: None UDS: None Last OV:11.17.20 Next OV: 5.17.21 Last Refill: Unknown  Pt requested via MyChart message   Please advise

## 2019-09-27 NOTE — Patient Instructions (Signed)
Muscle relaxer for next 3 nights See me again in 4-6 weeks

## 2019-09-28 ENCOUNTER — Encounter: Payer: Self-pay | Admitting: Family Medicine

## 2019-10-28 ENCOUNTER — Other Ambulatory Visit: Payer: Self-pay | Admitting: Family Medicine

## 2019-11-08 ENCOUNTER — Ambulatory Visit (INDEPENDENT_AMBULATORY_CARE_PROVIDER_SITE_OTHER): Payer: Commercial Managed Care - PPO | Admitting: Family Medicine

## 2019-11-08 ENCOUNTER — Encounter: Payer: Self-pay | Admitting: Family Medicine

## 2019-11-08 ENCOUNTER — Other Ambulatory Visit: Payer: Self-pay

## 2019-11-08 VITALS — BP 134/80 | HR 71 | Ht 72.0 in | Wt 268.0 lb

## 2019-11-08 DIAGNOSIS — M999 Biomechanical lesion, unspecified: Secondary | ICD-10-CM | POA: Diagnosis not present

## 2019-11-08 DIAGNOSIS — M5416 Radiculopathy, lumbar region: Secondary | ICD-10-CM | POA: Diagnosis not present

## 2019-11-08 NOTE — Patient Instructions (Signed)
A little tighter Exercises are key  Will look for chiro in [place]  See me again in 4-6 weeks

## 2019-11-08 NOTE — Progress Notes (Signed)
[place] [address] [place] Phone: [phone] Subjective:   I Jesse [name] am serving as a Education administrator for Dr. Hulan Saas.  This visit occurred during the SARS-CoV-2 public health emergency.  Safety protocols were in place, including screening questions prior to the visit, additional usage of staff PPE, and extensive cleaning of exam room while observing appropriate contact time as indicated for disinfecting solutions.   I'm seeing this patient by the request  of:  [name], DO  CC: Low back and hip pain follow-up  [id number]  Jesse [name] is a 49 y.o. male coming in with complaint of back pain. Last seen 09/27/2019 for OMT. Patient states he has had some instability recently.  Patient feels like from time to time his hip and his back seems to hold shift that does cause him to feel like he would be unstable for second.  Patient is trying to increase activity but finds it somewhat difficult.  Patient has been doing no more things such as yard work.    Past Medical History:  Diagnosis Date  . Arthritis   . Diabetes mellitus without complication ([place])   . Gout   . Hyperlipidemia   . Hypertension    Past Surgical History:  Procedure Laterality Date  . HERNIA REPAIR  2019  . KNEE SURGERY Left 2018   Social History   Socioeconomic History  . Marital status: Single    Spouse name: Not on file  . Number of children: Not on file  . Years of education: Not on file  . Highest education level: Not on file  Occupational History  . Not on file  Tobacco Use  . Smoking status: Never Smoker  . Smokeless tobacco: Never Used  Substance and Sexual Activity  . Alcohol use: Never  . Drug use: Never  . Sexual activity: Not on file  Other Topics Concern  . Not on file  Social History Narrative  . Not on file   Social Determinants of Health   Financial Resource Strain:   . Difficulty of Paying Living Expenses:     Food Insecurity:   . Worried About Charity fundraiser in the Last Year:   . Arboriculturist in the Last Year:   Transportation Needs:   . Film/video editor (Medical):   [name] Lack of Transportation (Non-Medical):   Physical Activity:   . Days of Exercise per Week:   . Minutes of Exercise per Session:   Stress:   . Feeling of Stress :   Social Connections:   . Frequency of Communication with Friends and Family:   . Frequency of Social Gatherings with Friends and Family:   . Attends Religious Services:   . Active Member of Clubs or Organizations:   . Attends Archivist Meetings:   [name] Marital Status:    No Known Allergies Family History  Problem Relation Age of Onset  . Heart disease Father   . Heart disease Paternal Grandmother   . Heart disease Paternal Grandfather     Current Outpatient Medications (Endocrine & Metabolic):  .  Semaglutide, 1 MG/DOSE, (OZEMPIC, 1 MG/DOSE,) 2 MG/1.5ML SOPN, Inject 1 mg into the skin once a week.  Current Outpatient Medications (Cardiovascular):  .  atorvastatin (LIPITOR) 40 MG tablet, Take 1 tablet (40 mg total) by mouth daily. .  fenofibrate micronized (LOFIBRA) 200 MG capsule, TAKE 1 CAPSULE DAILY BEFORE BREAKFAST .  lisinopril-hydrochlorothiazide (ZESTORETIC) 20-12.5 MG  tablet, TAKE 1 TABLET DAILY   Current Outpatient Medications (Analgesics):  .  allopurinol (ZYLOPRIM) 300 MG tablet, TAKE 1 TABLET DAILY .  meloxicam (MOBIC) 15 MG tablet, TAKE 1 TABLET DAILY   Current Outpatient Medications (Other):  .  acyclovir (ZOVIRAX) 400 MG tablet, TAKE 1 TABLET TWICE A DAY .  ALPRAZolam (XANAX) 0.5 MG tablet, Take 1 tablet (0.5 mg total) by mouth at bedtime as needed for anxiety. [name]  buPROPion (WELLBUTRIN XL) 300 MG 24 hr tablet, TAKE 1 TABLET DAILY .  gabapentin (NEURONTIN) 100 MG capsule, TAKE 2 CAPSULES AT BEDTIME .  Vitamin D, Ergocalciferol, (DRISDOL) 1.25 MG (50000 UT) CAPS capsule, TAKE 1 CAPSULE EVERY 7 DAYS   Reviewed  prior external information including notes and imaging from  primary care provider As well as notes that were available from care everywhere and other healthcare systems.  Past medical history, social, surgical and family history all reviewed in electronic medical record.  No pertanent information unless stated regarding to the chief complaint.   Review of Systems:  No headache, visual changes, nausea, vomiting, diarrhea, constipation, dizziness, abdominal pain, skin rash, fevers, chills, night sweats, weight loss, swollen lymph nodes, body aches, joint swelling, chest pain, shortness of breath, mood changes. POSITIVE muscle aches  Objective  Blood pressure 134/80, pulse 71, height 6' (1.829 m), weight 268 lb (121.6 kg), SpO2 97 %.   General: No apparent distress alert and oriented x3 mood and affect normal, dressed appropriately.  HEENT: Pupils equal, extraocular movements intact  Respiratory: Patient's speak in full sentences and does not appear short of breath  Cardiovascular: No lower extremity edema, non tender, no erythema  Neuro: Cranial nerves II through XII are intact, neurovascularly intact in all extremities with 2+ DTRs and 2+ pulses.  Gait normal with good balance and coordination.  MSK:  Non tender with full range of motion and good stability and symmetric strength and tone of shoulders, elbows, wrist, knee and ankles bilaterally.  Decreased range of motion of the hip noted with positive grind test still noted.  Patient's back significant tightness noted.  Patient's tightness with Corky Sox test bilaterally.  Osteopathic findings   T7 extended rotated and side bent left L2 flexed rotated and side bent right L2 flexed rotated and side bent left Sacrum right on right    Impression and Recommendations:     This case required medical decision making of moderate complexity. The above documentation has been reviewed and is accurate and complete [name], DO        Note: This dictation was prepared with Dragon dictation along with smaller phrase technology. Any transcriptional errors that result from this process are unintentional.

## 2019-11-09 ENCOUNTER — Encounter: Payer: Self-pay | Admitting: Family Medicine

## 2019-11-09 NOTE — Assessment & Plan Note (Signed)
Chronic problem with mild exacerbation.  Discussed medication management including patient's gabapentin and allopurinol.  Discussed meloxicam.  Patient has been taking most of these fairly regularly.  We may need to consider the possibility of other medicines such as Cymbalta or Effexor.  Patient is to increase activity slowly again.  Encourage weight loss.  Follow-up again 4 to 8 weeks

## 2019-11-09 NOTE — Assessment & Plan Note (Addendum)

## 2019-11-29 ENCOUNTER — Other Ambulatory Visit: Payer: Self-pay | Admitting: Family Medicine

## 2019-12-06 ENCOUNTER — Ambulatory Visit: Payer: Commercial Managed Care - PPO | Admitting: Family Medicine

## 2019-12-12 ENCOUNTER — Other Ambulatory Visit: Payer: Self-pay | Admitting: Family Medicine

## 2019-12-12 DIAGNOSIS — E1169 Type 2 diabetes mellitus with other specified complication: Secondary | ICD-10-CM

## 2019-12-12 DIAGNOSIS — E669 Obesity, unspecified: Secondary | ICD-10-CM

## 2019-12-13 ENCOUNTER — Other Ambulatory Visit: Payer: Self-pay | Admitting: Family Medicine

## 2019-12-13 DIAGNOSIS — E1169 Type 2 diabetes mellitus with other specified complication: Secondary | ICD-10-CM

## 2019-12-13 DIAGNOSIS — E669 Obesity, unspecified: Secondary | ICD-10-CM

## 2019-12-13 MED ORDER — ATORVASTATIN CALCIUM 40 MG PO TABS: 40.0000 mg | ORAL_TABLET | Freq: Every day | ORAL | 1 refills | Status: DC

## 2019-12-21 ENCOUNTER — Ambulatory Visit: Payer: Commercial Managed Care - PPO | Admitting: Family Medicine

## 2020-01-11 ENCOUNTER — Other Ambulatory Visit: Payer: Self-pay

## 2020-01-11 ENCOUNTER — Ambulatory Visit (INDEPENDENT_AMBULATORY_CARE_PROVIDER_SITE_OTHER): Payer: Commercial Managed Care - PPO | Admitting: Family Medicine

## 2020-01-11 ENCOUNTER — Encounter: Payer: Self-pay | Admitting: Family Medicine

## 2020-01-11 VITALS — BP 136/82 | HR 86 | Ht 72.0 in | Wt 289.0 lb

## 2020-01-11 DIAGNOSIS — M5416 Radiculopathy, lumbar region: Secondary | ICD-10-CM

## 2020-01-11 DIAGNOSIS — M999 Biomechanical lesion, unspecified: Secondary | ICD-10-CM

## 2020-01-11 NOTE — Assessment & Plan Note (Signed)

## 2020-01-11 NOTE — Progress Notes (Signed)
[place] Phone: [phone] Subjective:   Jesse [name], am serving as a scribe for Dr. Hulan Saas. This visit occurred during the SARS-CoV-2 public health emergency.  Safety protocols were in place, including screening questions prior to the visit, additional usage of staff PPE, and extensive cleaning of exam room while observing appropriate contact time as indicated for disinfecting solutions.   I'm seeing this patient by the request  of:  [name], DO  CC: Low back pain and hip pain follow-up  [id number]  Jesse [name] is a 49 y.o. male coming in with complaint of back and neck pain. OMT 11/08/2019. Patient states doing relatively well.  Mild exacerbation or worsening symptoms recently.  Has not been quite as active.  Has also noticed some increase in weight gain recently that is contributing.  Medications patient has been prescribed: Gabapentin which she has been taking as well as meloxicam and allopurinol         Reviewed prior external information including notes and imaging from previsou exam, outside providers and external EMR if available.   As well as notes that were available from care everywhere and other healthcare systems.  Past medical history, social, surgical and family history all reviewed in electronic medical record.  [name] pertanent information unless stated regarding to the chief complaint.   Past Medical History:  Diagnosis Date  . Arthritis   . Diabetes mellitus without complication ([place])   . Gout   . Hyperlipidemia   . Hypertension     [name] Known Allergies   Review of Systems:  [name] headache, visual changes, nausea, vomiting, diarrhea, constipation, dizziness, abdominal pain, skin rash, fevers, chills, night sweats, weight loss, swollen lymph nodes, body aches, joint swelling, chest pain, shortness of breath, mood changes. POSITIVE muscle aches  Objective    Blood pressure 136/82, pulse 86, height 6' (1.829 m), weight 289 lb (131.1 kg), SpO2 95 %.   General: [name] apparent distress alert and oriented x3 mood and affect normal, dressed appropriately.  HEENT: Pupils equal, extraocular movements intact  Respiratory: Patient's speak in full sentences and does not appear short of breath  Cardiovascular: [name] lower extremity edema, non tender, [name] erythema  Neuro: Cranial nerves II through XII are intact, neurovascularly intact in all extremities with 2+ DTRs and 2+ pulses.  Gait normal with good balance and coordination.  MSK: Mild limited range of motion of the right hip still noted especially with internal rotation. Back -low back exam does have some loss of lordosis.  Patient does have increasing tightness noted of the right leg and the right hip again.  Patient does have some mild radicular symptoms which is new from previous exam at about 25 to 35 degrees of forward flexion.    Osteopathic findings  C2 flexed rotated and side bent right C6 flexed rotated and side bent left T3 extended rotated and side bent right inhaled rib T9 extended rotated and side bent left L2 flexed rotated and side bent right Sacrum right on right       Assessment and Plan:  Lumbar radiculopathy Chronic problem, mild exacerbation.  Does have the underlying arthritis of the right hip to we will continue to monitor.  I do feel underlying medication of the allopurinol has helped with some of the exacerbation I could have been associated with some more acid deposits.  Discussed icing regimen and home exercises, increase activity slowly.  Follow-up again 4 to  8 weeks  Nonallopathic lesion of lumbosacral region   Decision today to treat with OMT was based on Physical Exam  After verbal consent patient was treated with HVLA, ME, FPR techniques in  thoracic,  lumbar and sacral areas, all areas are chronic   Patient tolerated the procedure well with improvement in  symptoms  Patient given exercises, stretches and lifestyle modifications  See medications in patient instructions if given  Patient will follow up in 4-8 weeks   discussed Zanaflex and gabapentin as well        The above documentation has been reviewed and is accurate and complete [name], DO       Note: This dictation was prepared with Dragon dictation along with smaller phrase technology. Any transcriptional errors that result from this process are unintentional.

## 2020-01-11 NOTE — Patient Instructions (Signed)
Good to see you  Ice 20 minutes 2 times daily. Usually after activity and before bed. Keep watching the weight  See me again in 6-8 weeks

## 2020-01-11 NOTE — Assessment & Plan Note (Signed)
Chronic problem, mild exacerbation.  Does have the underlying arthritis of the right hip to we will continue to monitor.  I do feel underlying medication of the allopurinol has helped with some of the exacerbation I could have been associated with some more acid deposits.  Discussed icing regimen and home exercises, increase activity slowly.  Follow-up again 4 to 8 weeks

## 2020-03-01 ENCOUNTER — Other Ambulatory Visit: Payer: Self-pay | Admitting: Family Medicine

## 2020-03-01 DIAGNOSIS — I10 Essential (primary) hypertension: Secondary | ICD-10-CM

## 2020-03-09 ENCOUNTER — Encounter: Payer: Self-pay | Admitting: Family Medicine

## 2020-03-09 ENCOUNTER — Other Ambulatory Visit: Payer: Self-pay

## 2020-03-09 ENCOUNTER — Ambulatory Visit (INDEPENDENT_AMBULATORY_CARE_PROVIDER_SITE_OTHER): Payer: Commercial Managed Care - PPO | Admitting: Family Medicine

## 2020-03-09 VITALS — BP 130/84 | HR 88 | Ht 72.0 in | Wt 291.0 lb

## 2020-03-09 DIAGNOSIS — M999 Biomechanical lesion, unspecified: Secondary | ICD-10-CM | POA: Diagnosis not present

## 2020-03-09 DIAGNOSIS — M5416 Radiculopathy, lumbar region: Secondary | ICD-10-CM | POA: Diagnosis not present

## 2020-03-09 NOTE — Progress Notes (Signed)
[place] Phone: [phone] Subjective:   [name], am serving as a scribe for Dr. Hulan Saas. This visit occurred during the SARS-CoV-2 public health emergency.  Safety protocols were in place, including screening questions prior to the visit, additional usage of staff PPE, and extensive cleaning of exam room while observing appropriate contact time as indicated for disinfecting solutions.  I'm seeing this patient by the request  of:  [name], DO  CC: Low back pain and hip pain follow-up  [id number]  Nycholas Rayner is a 49 y.o. male coming in with complaint of back and neck pain. OMT 01/11/2020. Patient states that his right hip pain increased with a lot of walking.  Has a known labral pathology as well as some arthritic changes of the right hip.  Doing relatively well.  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  Medications patient has been prescribed: Zanaflex, meloxicam  Taking: Intermittently         Reviewed prior external information including notes and imaging from previsou exam, outside providers and external EMR if available.   As well as notes that were available from care everywhere and other healthcare systems.  Past medical history, social, surgical and family history all reviewed in electronic medical record.  No pertanent information unless stated regarding to the chief complaint.   Past Medical History:  Diagnosis Date   Arthritis    Diabetes mellitus without complication ([facility])    Gout    Hyperlipidemia    Hypertension     No Known Allergies   Review of Systems:  No headache, visual changes, nausea, vomiting, diarrhea, constipation, dizziness, abdominal pain, skin rash, fevers, chills, night sweats, weight loss, swollen lymph nodes, body  aches, joint swelling, chest pain, shortness of breath, mood changes. POSITIVE muscle aches  Objective  Blood pressure 130/84, pulse 88, height 6' (1.829 m), weight 291 lb (132 kg), SpO2 97 %.   General: No apparent distress alert and oriented x3 mood and affect normal, dressed appropriately.  HEENT: Pupils equal, extraocular movements intact  Respiratory: Patient's speak in full sentences and does not appear short of breath  Cardiovascular: No lower extremity edema, non tender, no erythema  Neuro: Cranial nerves II through XII are intact, neurovascularly intact in all extremities with 2+ DTRs and 2+ pulses.  Gait normal with good balance and coordination.  MSK:  Non tender with full range of motion and good stability and symmetric strength and tone of shoulders, elbows, wrist, hip, knee and ankles bilaterally.  Back -low back exam shows very some mild tightness noted.  Some mild tenderness to palpation in the paraspinal musculature lumbar spine right greater than left.  Negative straight leg test.  Osteopathic findings  T7 extended rotated and side bent left L1 flexed rotated and side bent right Sacrum right on right       Assessment and Plan:    Nonallopathic problems  Decision today to treat with OMT was based on Physical Exam  After verbal consent patient was treated with HVLA, ME, FPR techniques in cervical, rib, thoracic, lumbar, and sacral  areas  Patient tolerated the procedure well with improvement in symptoms  Patient given exercises, stretches and lifestyle modifications  See medications in patient instructions if given  Patient will follow up in 4-8 weeks  The above documentation has been reviewed and is accurate and complete [name], DO       Note: This dictation was prepared with Dragon dictation along with smaller phrase technology. Any transcriptional errors that result from this process are unintentional.

## 2020-03-09 NOTE — Patient Instructions (Signed)
See me ain 7-8 weeks

## 2020-03-09 NOTE — Assessment & Plan Note (Signed)
Stable overall.  Responding well to conservative therapy.  Patient encouraged to continue to work on weight and core strengthening.  Patient will continue to monitor the hip as well.  Follow-up again in 4 to 8 weeks

## 2020-04-04 ENCOUNTER — Other Ambulatory Visit: Payer: Self-pay | Admitting: Family Medicine

## 2020-04-04 NOTE — Telephone Encounter (Signed)
Last OV--06/08/2019 Last RF--09/27/2019-- #30 with 1 refill

## 2020-04-27 ENCOUNTER — Ambulatory Visit: Payer: Commercial Managed Care - PPO | Admitting: Family Medicine

## 2020-05-11 ENCOUNTER — Encounter: Payer: Self-pay | Admitting: Family Medicine

## 2020-05-11 ENCOUNTER — Other Ambulatory Visit: Payer: Self-pay

## 2020-05-11 ENCOUNTER — Ambulatory Visit (INDEPENDENT_AMBULATORY_CARE_PROVIDER_SITE_OTHER): Payer: Commercial Managed Care - PPO | Admitting: Family Medicine

## 2020-05-11 VITALS — BP 136/74 | HR 91 | Ht 72.0 in | Wt 287.0 lb

## 2020-05-11 DIAGNOSIS — M1611 Unilateral primary osteoarthritis, right hip: Secondary | ICD-10-CM | POA: Diagnosis not present

## 2020-05-11 DIAGNOSIS — M999 Biomechanical lesion, unspecified: Secondary | ICD-10-CM | POA: Diagnosis not present

## 2020-05-11 NOTE — Patient Instructions (Addendum)
Good to see you  Over all not bad Continue to watch the hips See me again in  6-8 weeks

## 2020-05-11 NOTE — Progress Notes (Signed)
[name] [place] Phone: [phone] Subjective:   Jesse [name], am serving as a scribe for Dr. Hulan Saas. This visit occurred during the SARS-CoV-2 public health emergency.  Safety protocols were in place, including screening questions prior to the visit, additional usage of staff PPE, and extensive cleaning of exam room while observing appropriate contact time as indicated for disinfecting solutions.   I'm seeing this patient by the request  of:  [name], DO  CC: back and hip pain follow up   [id number]  Jesse [name] is a 49 y.o. male coming in with complaint of back and neck pain. OMT 03/09/2020. Patient states that he is feeling okay still having his back issues but has been decent with the manipulations. Still having his hip issues that he states has been ongoing and addressed in the past.   Medications patient has been prescribed: Gabapentin, Zanaflex, Vit D Taking: yes          Reviewed prior external information including notes and imaging from previsou exam, outside providers and external EMR if available.   As well as notes that were available from care everywhere and other healthcare systems.  Past medical history, social, surgical and family history all reviewed in electronic medical record.  No pertanent information unless stated regarding to the chief complaint.   Past Medical History:  Diagnosis Date  . Arthritis   . Diabetes mellitus without complication ([place])   . Gout   . Hyperlipidemia   . Hypertension     No Known Allergies   Review of Systems:  No headache, visual changes, nausea, vomiting, diarrhea, constipation, dizziness, abdominal pain, skin rash, fevers, chills, night sweats, weight loss, swollen lymph nodes, body aches, joint swelling, chest pain, shortness of breath, mood changes. POSITIVE muscle aches  Objective  Blood pressure 136/74, pulse 91, height  6' (1.829 m), weight 287 lb (130.2 kg), SpO2 98 %.   General: No apparent distress alert and oriented x3 mood and affect normal, dressed appropriately.  HEENT: Pupils equal, extraocular movements intact  Respiratory: Patient's speak in full sentences and does not appear short of breath  Cardiovascular: No lower extremity edema, non tender, no erythema  Neuro: Cranial nerves II through XII are intact, neurovascularly intact in all extremities with 2+ DTRs and 2+ pulses.  Gait normal with good balance and coordination.  MSK:  Non tender with full range of motion and good stability and symmetric strength and tone of shoulders, elbows, wrist, hip, knee and ankles bilaterally.  Back - tightness in LLL area but wors eright SI joing, tightness with hip ROM on right, mild loss of lordosis 5/5 strength of LE.   Osteopathic findings   T9 extended rotated and side bent left L1 flexed rotated and side bent right Sacrum right on right       Assessment and Plan:  Arthritis of right hip Mild worsening of chronic problem, still not ready for surgery, discussed HEP, weight, vitamins. Respond to OMT RTC in 6 weeks continue gabapentin     Nonallopathic problems  Decision today to treat with OMT was based on Physical Exam  After verbal consent patient was treated with HVLA, ME, FPR techniques in  thoracic, lumbar, and sacral  areas  Patient tolerated the procedure well with improvement in symptoms  Patient given exercises, stretches and lifestyle modifications  See medications in patient instructions if given  Patient will follow up in 4-8 weeks  The above documentation has been reviewed and is accurate and complete [name], DO       Note: This dictation was prepared with Dragon dictation along with smaller phrase technology. Any transcriptional errors that result from this process are unintentional.

## 2020-05-13 ENCOUNTER — Encounter: Payer: Self-pay | Admitting: Family Medicine

## 2020-05-13 NOTE — Assessment & Plan Note (Signed)
Mild worsening of chronic problem, still not ready for surgery, discussed HEP, weight, vitamins. Respond to OMT RTC in 6 weeks continue gabapentin

## 2020-05-23 ENCOUNTER — Other Ambulatory Visit: Payer: Self-pay | Admitting: Family Medicine

## 2020-05-23 DIAGNOSIS — E1169 Type 2 diabetes mellitus with other specified complication: Secondary | ICD-10-CM

## 2020-05-25 IMAGING — XA DG FLUORO GUIDE NDL PLC/BX
2 series · 5 of 5 positions shown · non-contrast
Comparison: none

CLINICAL DATA: Right hip pain

[Series 1: ortho standard · 1 of 1 slices shown (1 of 2)]
[im 1/1]
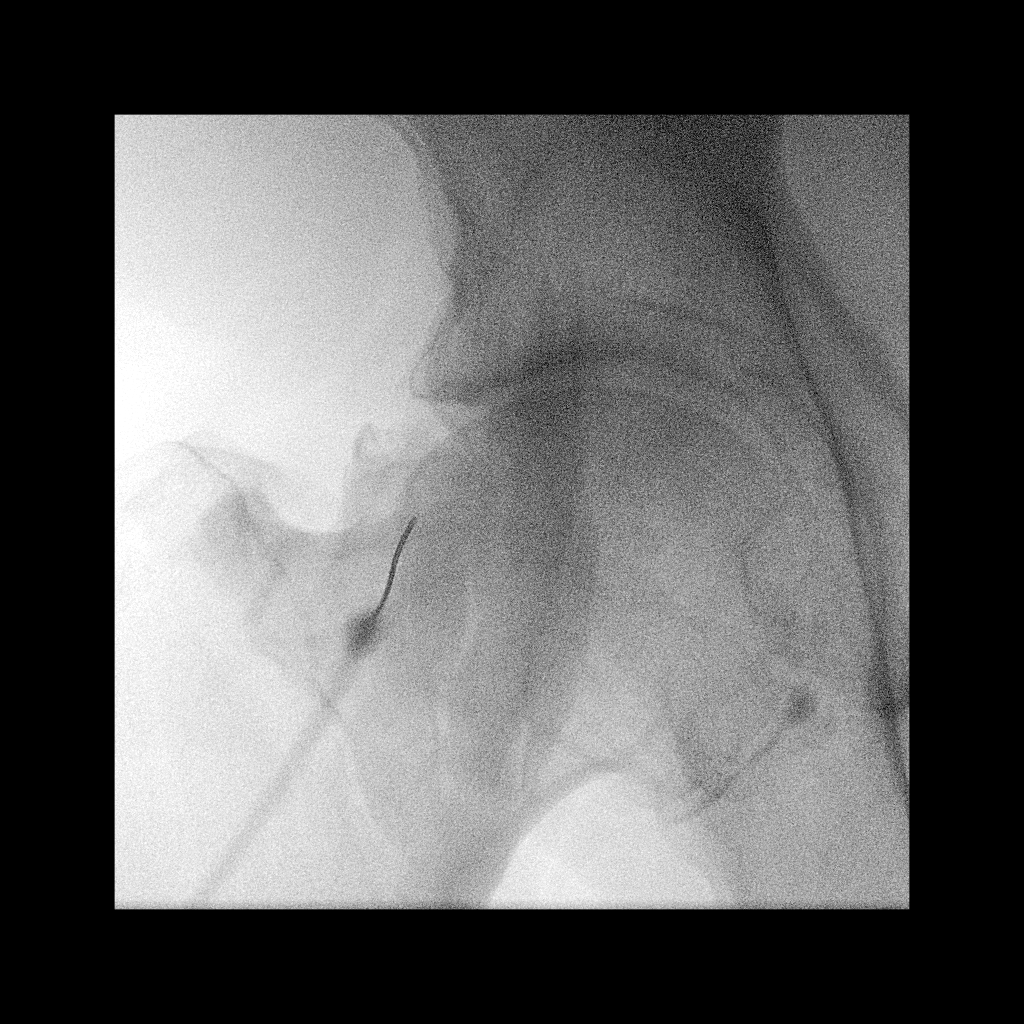

[Series 2: ortho standard · 4 of 6 frames shown (2 of 2)]
[frame 1/6]
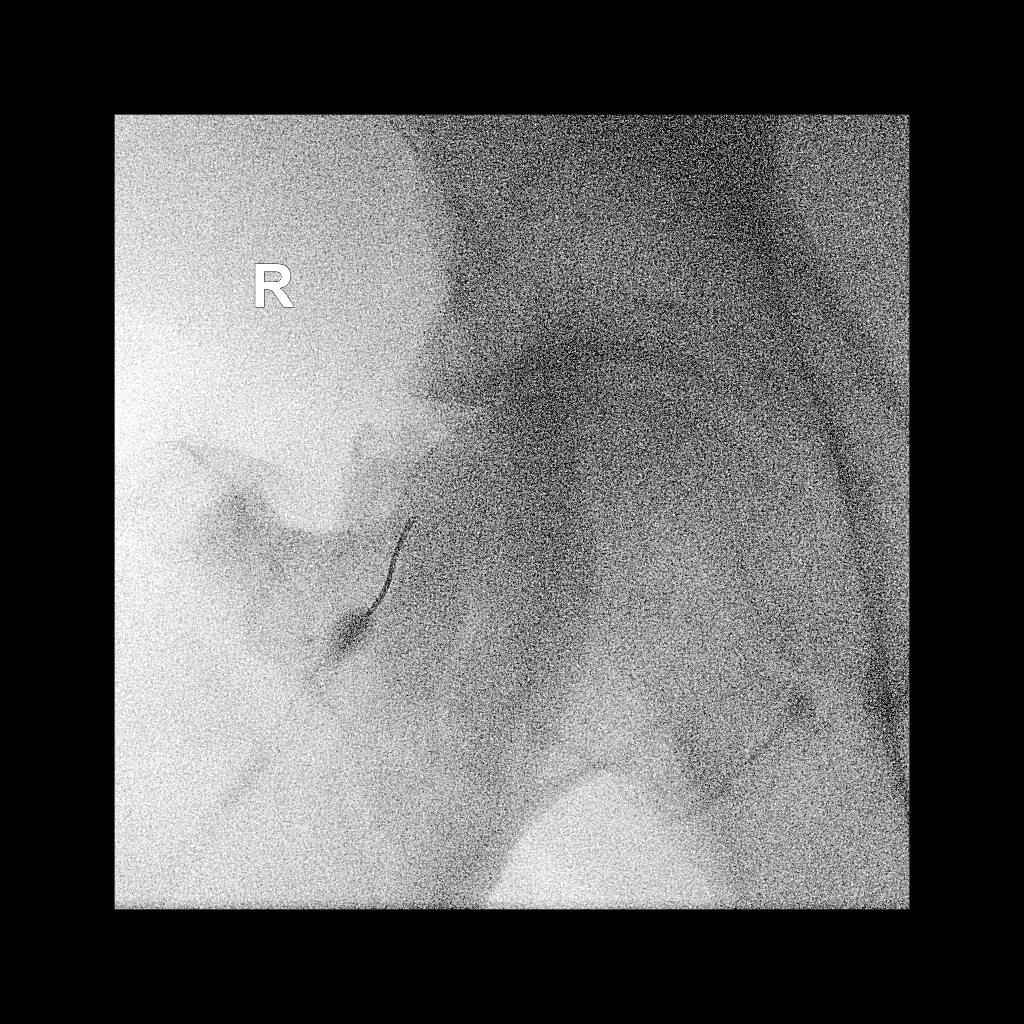
[frame 4/6]
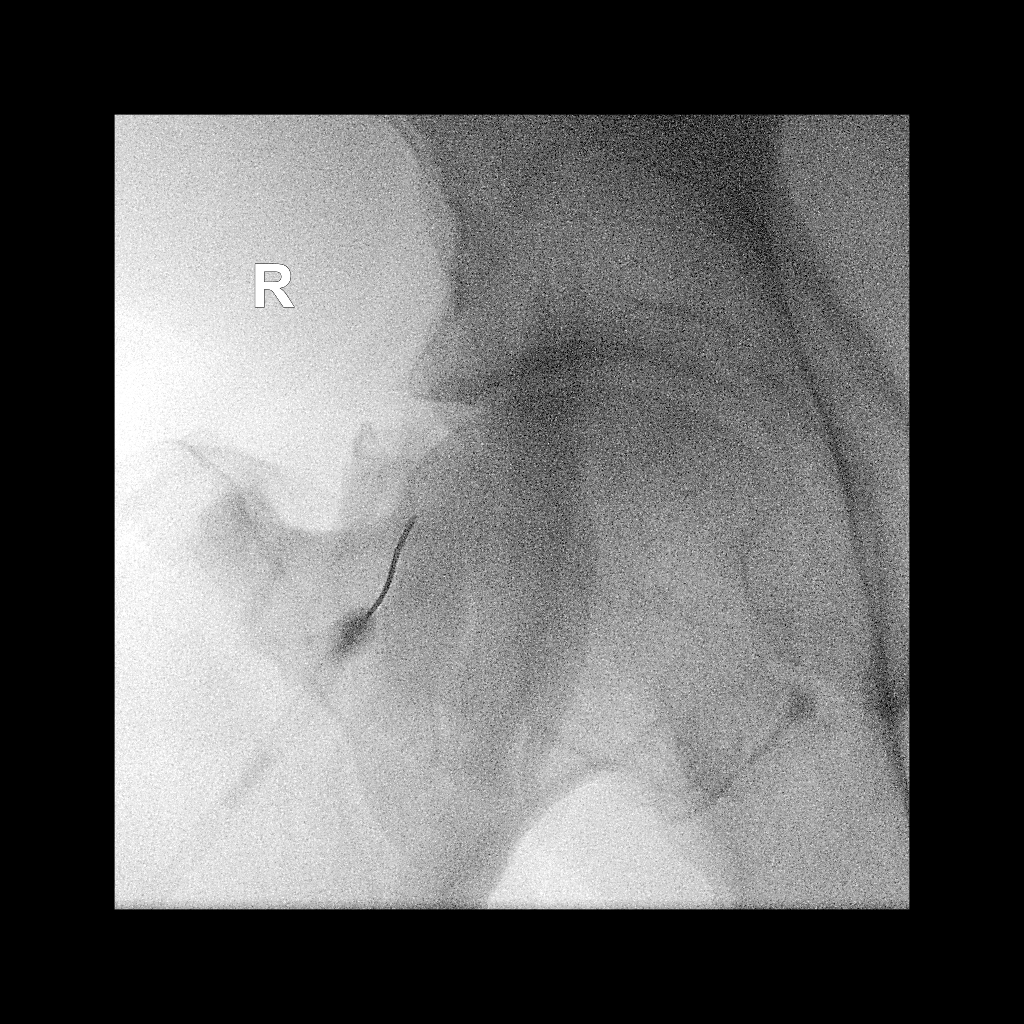
[frame 5/6]
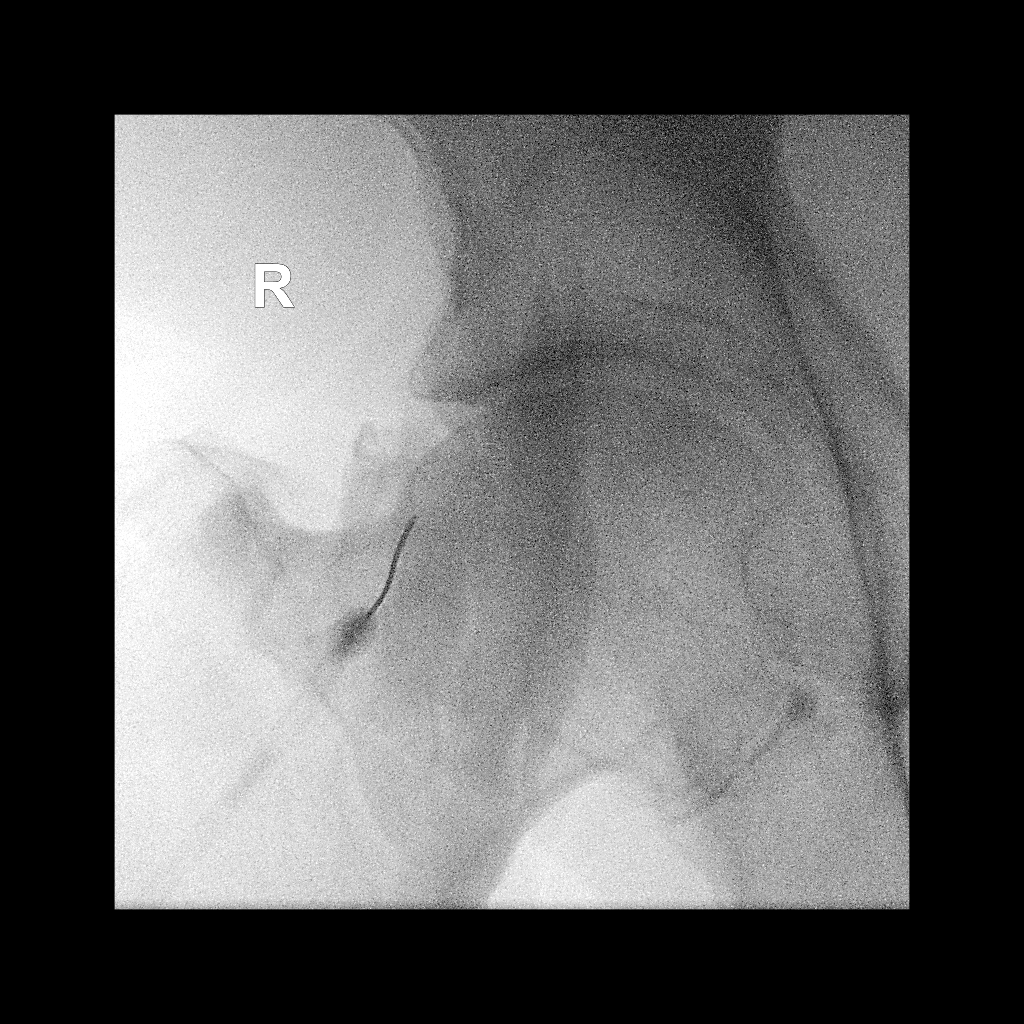
[frame 6/6]
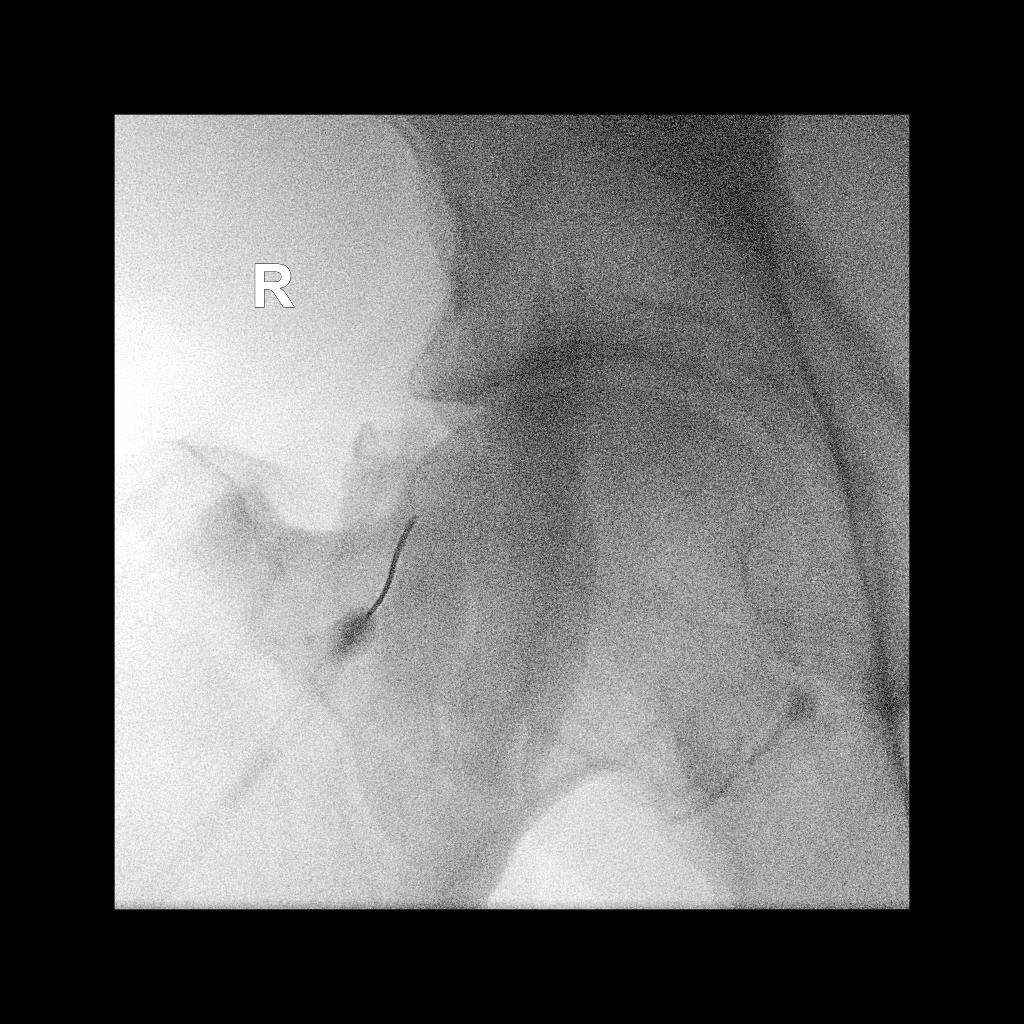

[5 of 5 positions shown; findings below may reference images not displayed]

EXAM:
RIGHT HIP INJECTION FOR MRI

FLUOROSCOPY TIME:  47 seconds

PROCEDURE:
Overlying skin prepped with Betadine, draped in the usual sterile
fashion, and infiltrated locally with Lidocaine. Curved 22 gauge
spinal needle advanced to the superolateral margin of the right
femoral head. 1 ml of Lidocaine injected easily. A mixture of 0.1 ml
MultiHance in 20 ml of dilute Isovue 200 was then used to opacify
the right femoral head.

COMPLICATIONS:
None
IMPRESSION: Technically successful right hip injection under fluoroscopy for MR
arthrogram.

## 2020-05-25 IMAGING — MR MR HIP*R* W/CM
4 of 5 series · 22 of 40 positions shown · IV contrast (agent unspecified)
Comparison: Injection images same date. Pelvic MRI 04/05/2018 and
pelvic CT 10/06/2015.

CLINICAL DATA: 46-year-old with right hip pain and right thigh
numbness for 10 months. No known injury or prior relevant surgery.

EXAM:
MRI OF THE RIGHT HIP WITH CONTRAST (MR Arthrogram)
TECHNIQUE: Multiplanar, multisequence MR imaging of the hip was performed
immediately following contrast injection into the hip joint under
fluoroscopic guidance. No intravenous contrast was administered.

[Series 9: T2 fat-sat · coronal · right · 3.0mm · 0.78mm/px · 8 of 33 slices shown]
[im 1/33]
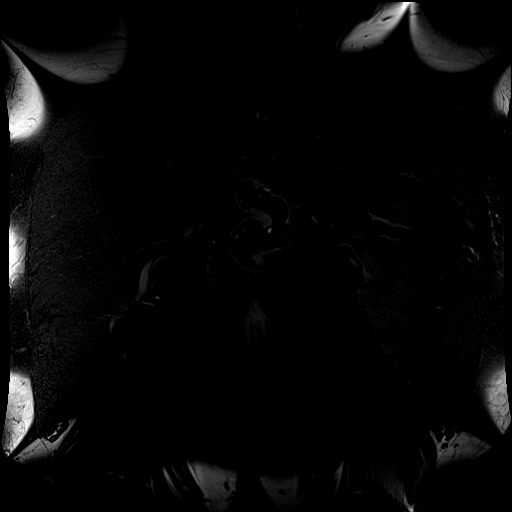
[im 5/33]
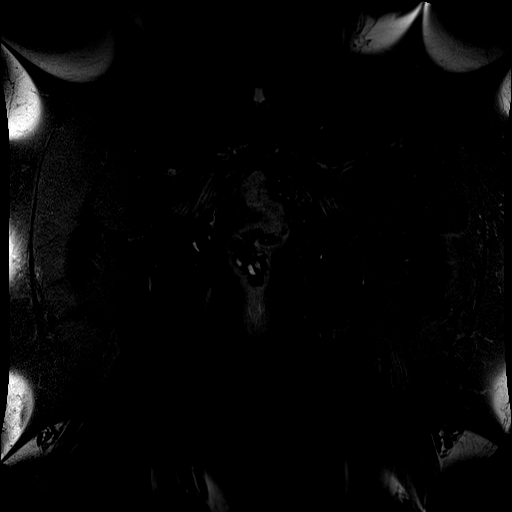
[im 10/33]
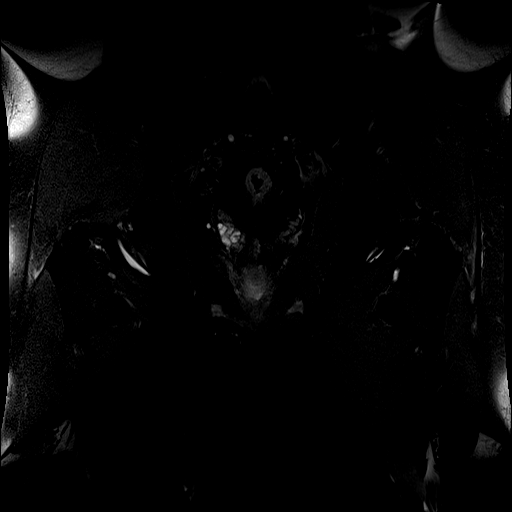
[im 14/33]
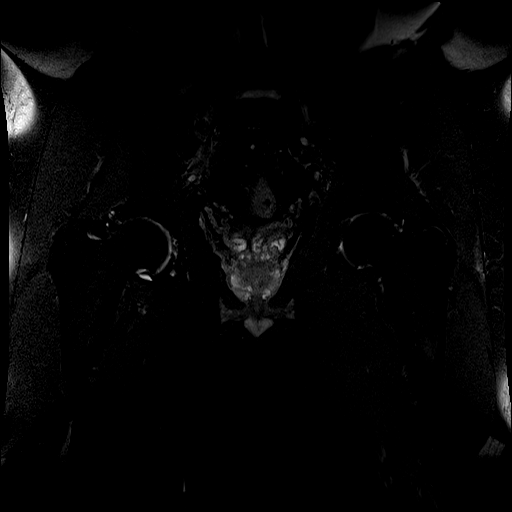
[im 19/33]
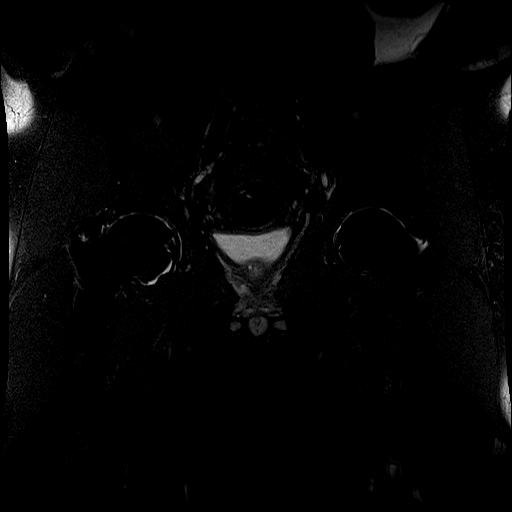
[im 23/33]
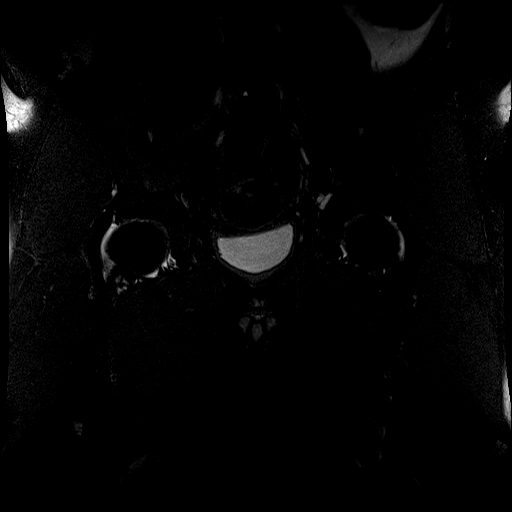
[im 28/33]
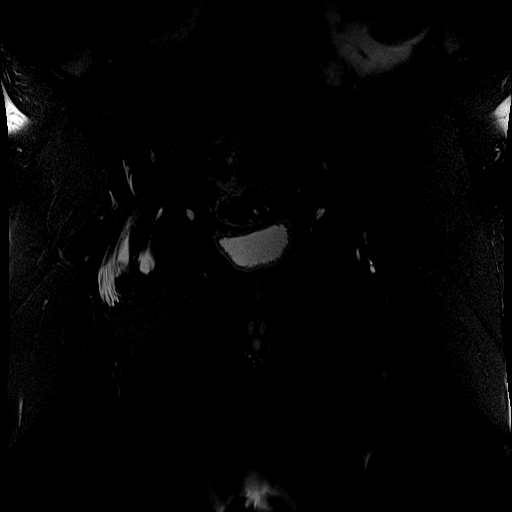
[im 33/33]
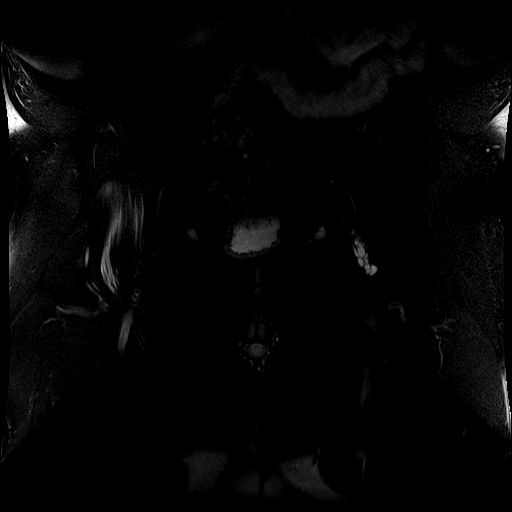

[Series 10: T1 · coronal · right · 3.0mm · 0.78mm/px · 8 of 30 slices shown]
[im 1/30]
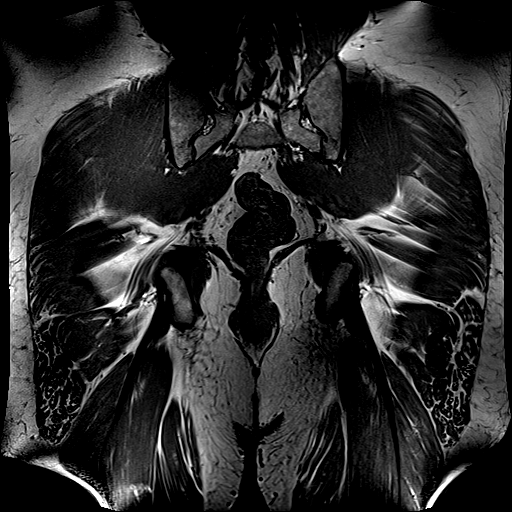
[im 5/30]
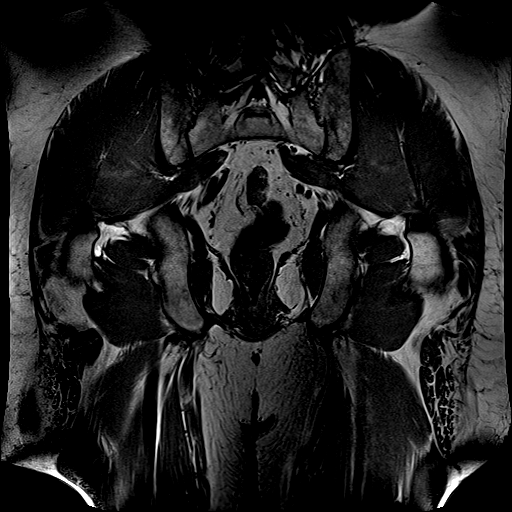
[im 9/30]
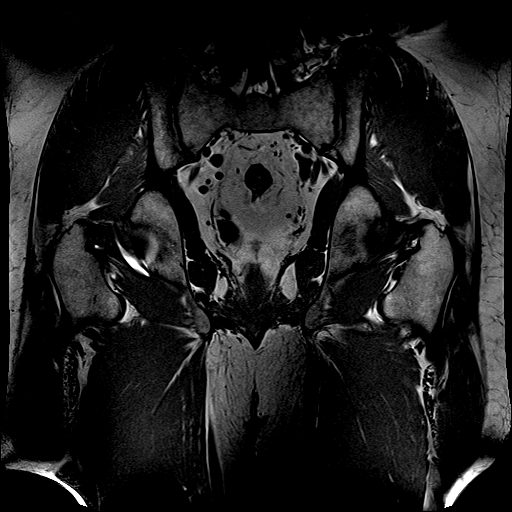
[im 13/30]
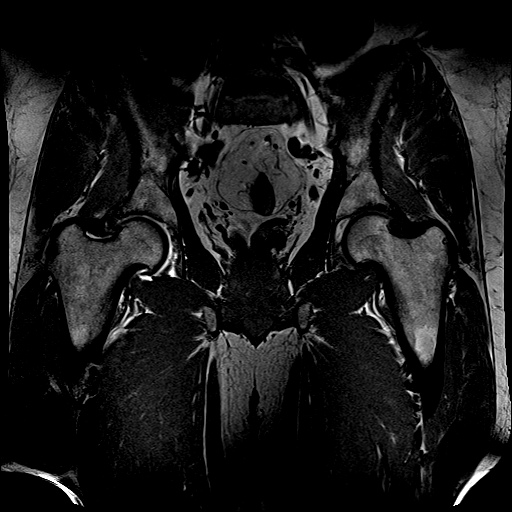
[im 17/30]
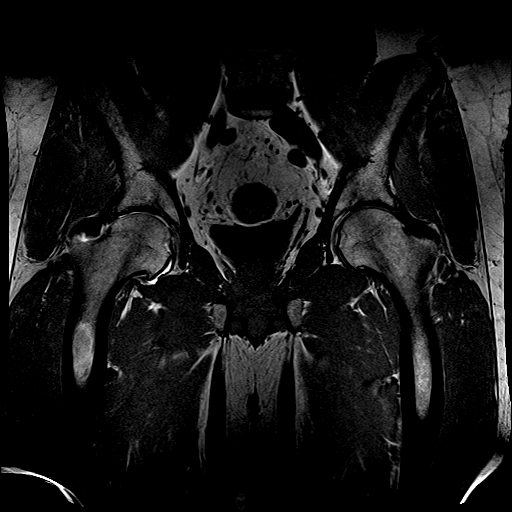
[im 21/30]
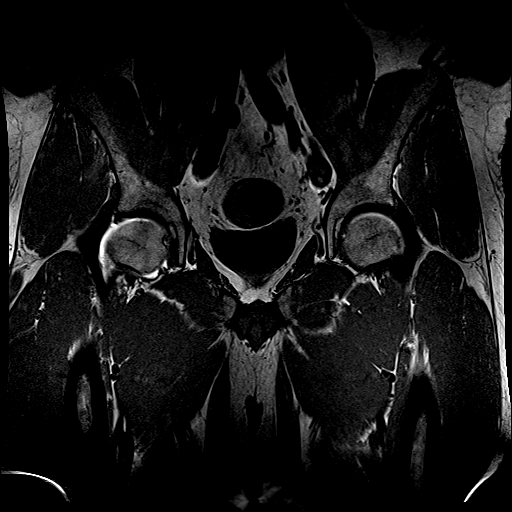
[im 25/30]
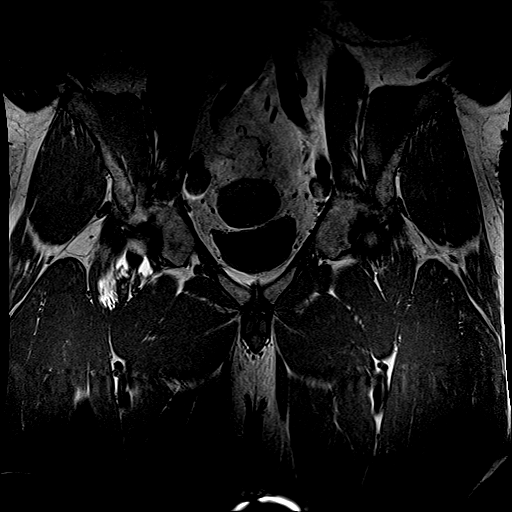
[im 30/30]
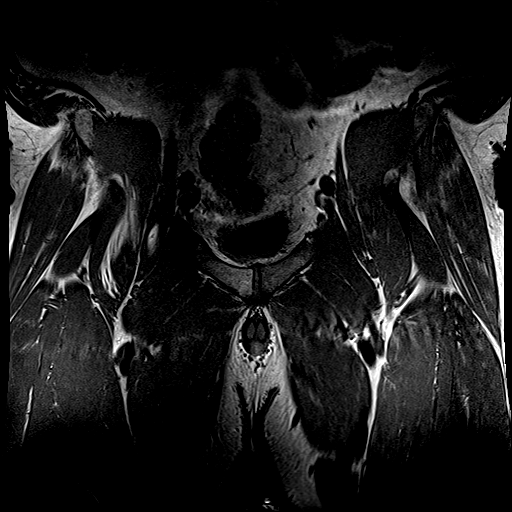

[Series 11: T1 fat-sat · axial · right · 3.0mm · 0.56mm/px · z∈[-51,+13]mm · 3 of 31 slices shown (1 of 2)]
[im 5/31]
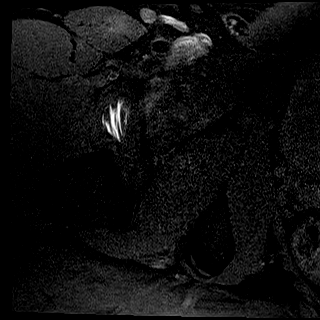
[im 18/31]
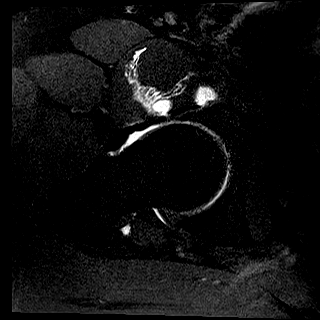
[im 26/31]
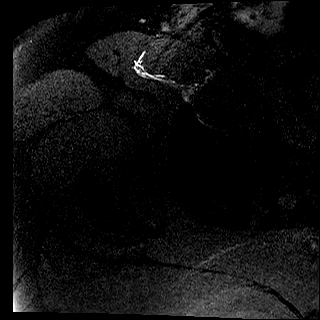

[Series 12: T1 fat-sat · coronal · right · 3.0mm · 0.56mm/px · 3 of 27 slices shown (2 of 2)]
[im 5/27]
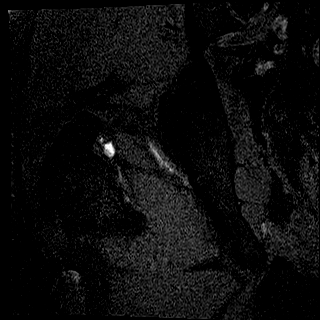
[im 14/27]
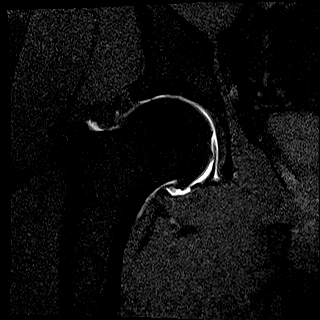
[im 22/27]
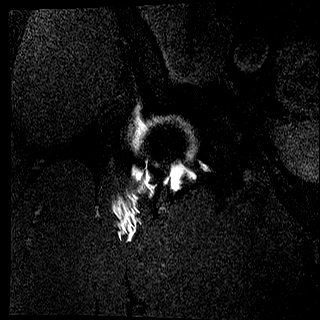

[22 of 40 positions shown; findings below may reference images not displayed]

FINDINGS: Bones: There is no evidence of acute fracture, dislocation or
avascular necrosis. The visualized bony pelvis appears normal. The
visualized sacroiliac joints and symphysis pubis appear normal.

Articular cartilage and labrum

Articular cartilage: Moderately advanced degenerative changes for
age at both hips (greater on the right) with chondral thinning,
surface irregularity and osteophyte formation.

Labrum: Diffuse degeneration of the right acetabular labrum again
noted, greatest superiorly and posteriorly. There are associated
paralabral cysts superior laterally on the T2 weighted images, not
opacified with gadolinium on the T1 weighted images.

Joint or bursal effusion

Joint effusion: The right hip is adequately distended with contrast.
No intra-articular loose bodies are identified. There is anterior
leakage of contrast at the injection site.

Bursae: There is no focal periarticular fluid collection on the
right. As above, there is anterior leakage of contrast at the
injection site. There is a stable small septated fluid collection
within the left iliopsoas bursa, only imaged on the coronal images.

Muscles and tendons

Muscles and tendons: The visualized gluteus, hamstring and iliopsoas
tendons appear normal. The piriformis muscles appear symmetric.

Other findings

Miscellaneous: The visualized internal pelvic contents appear
unremarkable.
IMPRESSION: 1. Degenerative tearing of the right acetabular labrum superiorly
and posteriorly with associated paralabral cyst formation, as
demonstrated on recent prior study. The paralabral cysts do not
opacify with contrast.
2. Right greater than left hip degenerative changes.
3. Small amount of fluid in the left iliopsoas bursa.

## 2020-06-09 ENCOUNTER — Other Ambulatory Visit: Payer: Self-pay | Admitting: Family Medicine

## 2020-07-06 ENCOUNTER — Encounter: Payer: Self-pay | Admitting: Family Medicine

## 2020-07-06 ENCOUNTER — Ambulatory Visit (INDEPENDENT_AMBULATORY_CARE_PROVIDER_SITE_OTHER): Payer: Commercial Managed Care - PPO | Admitting: Family Medicine

## 2020-07-06 ENCOUNTER — Other Ambulatory Visit: Payer: Self-pay

## 2020-07-06 VITALS — BP 150/92 | HR 97 | Ht 72.0 in | Wt 287.0 lb

## 2020-07-06 DIAGNOSIS — M999 Biomechanical lesion, unspecified: Secondary | ICD-10-CM

## 2020-07-06 DIAGNOSIS — M5416 Radiculopathy, lumbar region: Secondary | ICD-10-CM | POA: Diagnosis not present

## 2020-07-06 NOTE — Progress Notes (Signed)
[place] [address] [place] Phone: [phone] Subjective:   I Jesse [name] am serving as a Education administrator for Dr. Hulan Saas.  This visit occurred during the SARS-CoV-2 public health emergency.  Safety protocols were in place, including screening questions prior to the visit, additional usage of staff PPE, and extensive cleaning of exam room while observing appropriate contact time as indicated for disinfecting solutions.   I'm seeing this patient by the request  of:  [name], DO  CC: Low back pain follow-up  [id number]  Jesse [name] is a 49 y.o. male coming in with complaint of back and neck pain. OMT 05/11/2020. Patient states he is doing pretty good today.  Patient has known degenerative disc disease with some lumbar radiculopathy as well as arthritis of the hip.  Patient states that his back has been doing very well and has not needed any anti-inflammatories for quite some time.  Continues to take the gabapentin  Medications patient has been prescribed: Zanaflex, Meloxicam, Gabapentin          Reviewed prior external information including notes and imaging from previsou exam, outside providers and external EMR if available.   As well as notes that were available from care everywhere and other healthcare systems.  Past medical history, social, surgical and family history all reviewed in electronic medical record.  No pertanent information unless stated regarding to the chief complaint.   Past Medical History:  Diagnosis Date  . Arthritis   . Diabetes mellitus without complication ([place])   . Gout   . Hyperlipidemia   . Hypertension     No Known Allergies   Review of Systems:  No headache, visual changes, nausea, vomiting, diarrhea, constipation, dizziness, abdominal pain, skin rash, fevers, chills, night sweats, weight loss, swollen lymph nodes, body aches, joint swelling, chest pain, shortness of  breath, mood changes. POSITIVE muscle aches  Objective  There were no vitals taken for this visit.   General: No apparent distress alert and oriented x3 mood and affect normal, dressed appropriately.  HEENT: Pupils equal, extraocular movements intact  Respiratory: Patient's speak in full sentences and does not appear short of breath  Cardiovascular: No lower extremity edema, non tender, no erythema  Neuro: Cranial nerves II through XII are intact, neurovascularly intact in all extremities with 2+ DTRs and 2+ pulses.  Gait normal with good balance and coordination.  MSK: Continued limited range of motion of the right hip Back -low back exam does have some mild loss of lordosis.  Poor core strength.  Tightness noted with Corky Sox.  Negative straight leg test.  Osteopathic findings  T7 extended rotated and side bent left L5 flexed rotated and side bent left Sacrum right on right       Assessment and Plan:  Lumbar radiculopathy Patient is doing remarkably well at this time.  No changes in management.  Patient has the other medications and uses the meloxicam very intermittently but more for the arthritis of the hip.  Patient will continue conservative therapy and follow-up with me again 8 weeks    Nonallopathic problems  Decision today to treat with OMT was based on Physical Exam  After verbal consent patient was treated with HVLA, ME, FPR techniques in  thoracic, lumbar, and sacral  areas  Patient tolerated the procedure well with improvement in symptoms  Patient given exercises, stretches and lifestyle modifications  See medications in patient instructions if given  Patient will follow up in  4-8 weeks      The above documentation has been reviewed and is accurate and complete Jacqualin Combes       Note: This dictation was prepared with Dragon dictation along with smaller phrase technology. Any transcriptional errors that result from this process are unintentional.

## 2020-07-06 NOTE — Patient Instructions (Addendum)
Good to see you Looking great Keep it up See me again in 8 weeks

## 2020-07-06 NOTE — Assessment & Plan Note (Signed)
Patient is doing remarkably well at this time.  No changes in management.  Patient has the other medications and uses the meloxicam very intermittently but more for the arthritis of the hip.  Patient will continue conservative therapy and follow-up with me again 8 weeks

## 2020-08-10 ENCOUNTER — Other Ambulatory Visit: Payer: Self-pay | Admitting: Family Medicine

## 2020-08-30 NOTE — Progress Notes (Unsigned)
[facility] [place] [address] [place] Phone: [phone] Subjective:   I Jesse [name] am serving as a Education administrator for Dr. Hulan Saas.  This visit occurred during the SARS-CoV-2 public health emergency.  Safety protocols were in place, including screening questions prior to the visit, additional usage of staff PPE, and extensive cleaning of exam room while observing appropriate contact time as indicated for disinfecting solutions.   I'm seeing this patient by the request  of:  [name], DO  CC: Low back and hip pain follow-up  [id number]  Kasheem Toner is a 50 y.o. male coming in with complaint of back and neck pain. OMt 07/06/2020. Patient states his back is a little sore due to working out this week.  Patient states no new symptoms.  Patient did have Covid around Christmas and then got off his routine.  Patient is starting to workout and is going to be watching his weight a little closer.  Patient feels tightness and soreness but nothing severe.  Knows not to do high impact with the hip.  Medications patient has been prescribed: Vit D  Taking:         Reviewed prior external information including notes and imaging from previsou exam, outside providers and external EMR if available.   As well as notes that were available from care everywhere and other healthcare systems.  Past medical history, social, surgical and family history all reviewed in electronic medical record.  No pertanent information unless stated regarding to the chief complaint.   Past Medical History:  Diagnosis Date  . Arthritis   . Diabetes mellitus without complication ([place])   . Gout   . Hyperlipidemia   . Hypertension     No Known Allergies   Review of Systems:  No headache, visual changes, nausea, vomiting, diarrhea, constipation, dizziness, abdominal pain, skin rash, fevers, chills, night sweats, weight loss, swollen lymph nodes, body  aches, joint swelling, chest pain, shortness of breath, mood changes. POSITIVE muscle aches  Objective  Blood pressure (!) 144/80, pulse 85, height 6' (1.829 m), weight 275 lb (124.7 kg), SpO2 99 %.   General: No apparent distress alert and oriented x3 mood and affect normal, dressed appropriately.  HEENT: Pupils equal, extraocular movements intact  Respiratory: Patient's speak in full sentences and does not appear short of breath  Cardiovascular: No lower extremity edema, non tender, no erythema  Gait normal with good balance and coordination.  Back -low back does show some loss of lordosis.  Some tenderness to palpation in the paraspinal musculature lumbar spine right greater than left.  Mild increase in discomfort in the thoracolumbar juncture continued limited range of motion of the right hip  Osteopathic findings   T11 extended rotated and side bent left L2 flexed rotated and side bent right L5 flexed rotated and side bent left Sacrum right on right       Assessment and Plan:  Lumbar radiculopathy Likely no radicular symptoms.  Patient does have the gabapentin.  Pain is moderate patient will really on a regular basis.  Discussed with patient start increased workouts to monitor some of the symptoms and especially avoiding contact exercising.  Follow-up with me in 6 weeks    Nonallopathic problems  Decision today to treat with OMT was based on Physical Exam  After verbal consent patient was treated with HVLA, ME, FPR techniques in  thoracic, lumbar, and sacral  areas  Patient tolerated the procedure well with improvement in  symptoms  Patient given exercises, stretches and lifestyle modifications  See medications in patient instructions if given  Patient will follow up in 4-8 weeks      The above documentation has been reviewed and is accurate and complete [name], DO       Note: This dictation was prepared with Dragon dictation along with smaller phrase  technology. Any transcriptional errors that result from this process are unintentional.

## 2020-08-31 ENCOUNTER — Encounter: Payer: Self-pay | Admitting: Family Medicine

## 2020-08-31 ENCOUNTER — Ambulatory Visit (INDEPENDENT_AMBULATORY_CARE_PROVIDER_SITE_OTHER): Payer: Commercial Managed Care - PPO | Admitting: Family Medicine

## 2020-08-31 ENCOUNTER — Other Ambulatory Visit: Payer: Self-pay

## 2020-08-31 VITALS — BP 144/80 | HR 85 | Ht 72.0 in | Wt 275.0 lb

## 2020-08-31 DIAGNOSIS — M999 Biomechanical lesion, unspecified: Secondary | ICD-10-CM | POA: Diagnosis not present

## 2020-08-31 DIAGNOSIS — M5416 Radiculopathy, lumbar region: Secondary | ICD-10-CM

## 2020-08-31 NOTE — Patient Instructions (Addendum)
Good to see you Be careful with work out See me again in 6 weeks

## 2020-08-31 NOTE — Assessment & Plan Note (Signed)
Likely no radicular symptoms.  Patient does have the gabapentin.  Pain is moderate patient will really on a regular basis.  Discussed with patient start increased workouts to monitor some of the symptoms and especially avoiding contact exercising.  Follow-up with me in 6 weeks

## 2020-09-15 ENCOUNTER — Other Ambulatory Visit: Payer: Self-pay | Admitting: Family Medicine

## 2020-09-19 ENCOUNTER — Other Ambulatory Visit: Payer: Self-pay | Admitting: Family Medicine

## 2020-09-20 ENCOUNTER — Other Ambulatory Visit: Payer: Self-pay

## 2020-09-20 ENCOUNTER — Encounter: Payer: Self-pay | Admitting: Family Medicine

## 2020-09-20 ENCOUNTER — Ambulatory Visit (INDEPENDENT_AMBULATORY_CARE_PROVIDER_SITE_OTHER): Payer: Commercial Managed Care - PPO | Admitting: Family Medicine

## 2020-09-20 VITALS — BP 128/76 | HR 74 | Temp 98.4°F | Ht 72.0 in | Wt 269.4 lb

## 2020-09-20 DIAGNOSIS — Z1159 Encounter for screening for other viral diseases: Secondary | ICD-10-CM | POA: Diagnosis not present

## 2020-09-20 DIAGNOSIS — Z1211 Encounter for screening for malignant neoplasm of colon: Secondary | ICD-10-CM | POA: Diagnosis not present

## 2020-09-20 DIAGNOSIS — E1169 Type 2 diabetes mellitus with other specified complication: Secondary | ICD-10-CM

## 2020-09-20 DIAGNOSIS — E669 Obesity, unspecified: Secondary | ICD-10-CM | POA: Insufficient documentation

## 2020-09-20 DIAGNOSIS — Z0000 Encounter for general adult medical examination without abnormal findings: Secondary | ICD-10-CM

## 2020-09-20 DIAGNOSIS — Z1331 Encounter for screening for depression: Secondary | ICD-10-CM

## 2020-09-20 LAB — COMPREHENSIVE METABOLIC PANEL
ALT: 28 U/L (ref 0–53)
AST: 38 U/L — ABNORMAL HIGH (ref 0–37)
Albumin: 4.6 g/dL (ref 3.5–5.2)
Alkaline Phosphatase: 42 U/L (ref 39–117)
BUN: 12 mg/dL (ref 6–23)
CO2: 31 mEq/L (ref 19–32)
Calcium: 10.3 mg/dL (ref 8.4–10.5)
Chloride: 101 mEq/L (ref 96–112)
Creatinine, Ser: 1.08 mg/dL (ref 0.40–1.50)
GFR: 80.76 mL/min (ref >60.00)
Glucose, Bld: 129 mg/dL — ABNORMAL HIGH (ref 70–99)
Potassium: 4.1 mEq/L (ref 3.5–5.1)
Sodium: 139 mEq/L (ref 135–145)
Total Bilirubin: 0.6 mg/dL (ref 0.2–1.2)
Total Protein: 7.2 g/dL (ref 6.0–8.3)

## 2020-09-20 LAB — MICROALBUMIN / CREATININE URINE RATIO
Creatinine,U: 122.2 mg/dL
Microalb Creat Ratio: 2.4 mg/g (ref 0.0–30.0)
Microalb, Ur: 2.9 mg/dL — ABNORMAL HIGH (ref 0.0–1.9)

## 2020-09-20 LAB — LIPID PANEL
Cholesterol: 149 mg/dL (ref 0–200)
HDL: 31.3 mg/dL — ABNORMAL LOW (ref >39.00)
NonHDL: 117.51
Total CHOL/HDL Ratio: 5
Triglycerides: 270 mg/dL — ABNORMAL HIGH (ref 0.0–149.0)
VLDL: 54 mg/dL — ABNORMAL HIGH (ref 0.0–40.0)

## 2020-09-20 LAB — HEMOGLOBIN A1C: Hgb A1c MFr Bld: 6.5 % (ref 4.6–6.5)

## 2020-09-20 LAB — LDL CHOLESTEROL, DIRECT: Direct LDL: 82 mg/dL

## 2020-09-20 MED ORDER — ACYCLOVIR 400 MG PO TABS: 400.0000 mg | ORAL_TABLET | Freq: Two times a day (BID) | ORAL | 3 refills | Status: DC

## 2020-09-20 MED ORDER — ALPRAZOLAM 0.5 MG PO TABS: ORAL_TABLET | ORAL | 1 refills | Status: DC

## 2020-09-20 NOTE — Patient Instructions (Signed)
Give us 2-3 business days to get the results of your labs back.   Keep the diet clean and stay active.  If you do not hear anything about your referral in the next 1-2 weeks, call our office and ask for an update.  Let us know if you need anything. 

## 2020-09-20 NOTE — Progress Notes (Signed)
Chief Complaint  Patient presents with  . Annual Exam    Well Male Jesse [name] is here for a complete physical.   His last physical was >1 year ago.  Current diet: in general, a "healthy" diet.   Current exercise: cardio, wt resistance Weight trend: intentionally lose Fatigue out of ordinary? No. Seat belt? Yes.   Loss of interested in doing things or depression in past 2 weeks? No  Health maintenance Tetanus- Yes HIV- Yes Hep C- No  Past Medical History:  Diagnosis Date  . Arthritis   . Diabetes mellitus without complication ([place])   . Gout   . Hyperlipidemia   . Hypertension      Past Surgical History:  Procedure Laterality Date  . HERNIA REPAIR  2019  . KNEE SURGERY Left 2018    Medications  Current Outpatient Medications on File Prior to Visit  Medication Sig Dispense Refill  . allopurinol (ZYLOPRIM) 300 MG tablet TAKE 1 TABLET DAILY 90 tablet 3  . atorvastatin (LIPITOR) 40 MG tablet TAKE 1 TABLET DAILY 90 tablet 3  . buPROPion (WELLBUTRIN XL) 300 MG 24 hr tablet TAKE 1 TABLET DAILY 90 tablet 3  . fenofibrate micronized (LOFIBRA) 200 MG capsule TAKE 1 CAPSULE DAILY BEFORE BREAKFAST 90 capsule 3  . gabapentin (NEURONTIN) 100 MG capsule TAKE 2 CAPSULES AT BEDTIME 180 capsule 3  . lisinopril-hydrochlorothiazide (ZESTORETIC) 20-12.5 MG tablet TAKE 1 TABLET DAILY 90 tablet 3  . meloxicam (MOBIC) 15 MG tablet TAKE 1 TABLET DAILY 90 tablet 3  . OZEMPIC, 1 MG/DOSE, 2 MG/1.5ML SOPN INJECT 1 MG UNDER THE SKIN ONCE A WEEK 9 mL 3  . tiZANidine (ZANAFLEX) 4 MG capsule TAKE 1 CAPSULE BY MOUTH AT NIGHT 30 capsule 0  . Vitamin D, Ergocalciferol, (DRISDOL) 1.25 MG (50000 UNIT) CAPS capsule TAKE 1 CAPSULE EVERY 7 DAYS 12 capsule 3   No current facility-administered medications on file prior to visit.     Allergies No Known Allergies  Family History Family History  Problem Relation Age of Onset  . Heart disease Father   . Heart disease Paternal Grandmother   . Heart  disease Paternal Grandfather     Review of Systems: Constitutional: no fevers or chills Eye:  no recent significant change in vision Ear/Nose/Mouth/Throat:  Ears:  no hearing loss Nose/Mouth/Throat:  no complaints of nasal congestion, no sore throat Cardiovascular:  no chest pain Respiratory:  no shortness of breath Gastrointestinal:  no abdominal pain, no change in bowel habits GU:  Male: negative for dysuria, frequency, and incontinence Musculoskeletal/Extremities:  no pain of the joints Integumentary (Skin/Breast):  no abnormal skin lesions reported Neurologic:  no headaches Endocrine: No unexpected weight changes Hematologic/Lymphatic:  no night sweats  Exam BP 128/76 (BP Location: Left Arm, Patient Position: Sitting, Cuff Size: Large)   Pulse 74   Temp 98.4 F (36.9 C) (Oral)   Ht 6' (1.829 m)   Wt 269 lb 6 oz (122.2 kg)   SpO2 97%   BMI 36.53 kg/m  General:  well developed, well nourished, in no apparent distress Skin:  no significant moles, warts, or growths Head:  no masses, lesions, or tenderness Eyes:  pupils equal and round, sclera anicteric without injection Ears:  canals without lesions, TMs shiny without retraction, no obvious effusion, no erythema Nose:  nares patent, septum midline, mucosa normal Throat/Pharynx:  lips and gingiva without lesion; tongue and uvula midline; non-inflamed pharynx; no exudates or postnasal drainage Neck: neck supple without adenopathy, thyromegaly, or masses Lungs:  clear to auscultation, breath sounds equal bilaterally, no respiratory distress Cardio:  regular rate and rhythm, no bruits, no LE edema Abdomen:  abdomen soft, nontender; bowel sounds normal; no masses or organomegaly Rectal: Deferred Musculoskeletal:  symmetrical muscle groups noted without atrophy or deformity Extremities:  no clubbing, cyanosis, or edema, no deformities, no skin discoloration Neuro:  gait normal; deep tendon reflexes normal and symmetric Psych:  well oriented with normal range of affect and appropriate judgment/insight  Assessment and Plan  Well adult exam - Plan: Comprehensive metabolic panel, Lipid panel  Screen for colon cancer - Plan: Ambulatory referral to Gastroenterology  Encounter for hepatitis C screening test for low risk patient - Plan: Hepatitis C antibody  Depression screening negative  Diabetes mellitus type 2 in obese ([place]) - Plan: Microalbumin / creatinine urine ratio, Hemoglobin A1c   Well 50 y.o. male. Counseled on diet and exercise. Counseled on risks and benefits of prostate cancer screening with PSA. The patient agrees to forego screening.  Other orders as above. Follow up in 6 mo pending the above workup. The patient voiced understanding and agreement to the plan.  [place], DO 09/20/20 12:59 PM

## 2020-09-21 LAB — HEPATITIS C ANTIBODY
Hepatitis C Ab: NONREACTIVE
SIGNAL TO CUT-OFF: 0.01 (ref <1.00)

## 2020-09-26 ENCOUNTER — Other Ambulatory Visit: Payer: Self-pay

## 2020-09-26 DIAGNOSIS — R748 Abnormal levels of other serum enzymes: Secondary | ICD-10-CM

## 2020-09-26 DIAGNOSIS — E785 Hyperlipidemia, unspecified: Secondary | ICD-10-CM

## 2020-10-11 NOTE — Progress Notes (Deleted)
  [place] Phone: [phone] Subjective:    I'm seeing this patient by the request  of:  [name], DO  CC:   [id number]  Jesse [name] is a 50 y.o. male coming in with complaint of back and neck pain. OMT 08/31/2020. Patient states   Medications patient has been prescribed: Meloxicam  Taking:         Reviewed prior external information including notes and imaging from previsou exam, outside providers and external EMR if available.   As well as notes that were available from care everywhere and other healthcare systems.  Past medical history, social, surgical and family history all reviewed in electronic medical record.  No pertanent information unless stated regarding to the chief complaint.   Past Medical History:  Diagnosis Date  . Arthritis   . Diabetes mellitus without complication ([place])   . Gout   . Hyperlipidemia   . Hypertension     No Known Allergies   Review of Systems:  No headache, visual changes, nausea, vomiting, diarrhea, constipation, dizziness, abdominal pain, skin rash, fevers, chills, night sweats, weight loss, swollen lymph nodes, body aches, joint swelling, chest pain, shortness of breath, mood changes. POSITIVE muscle aches  Objective  There were no vitals taken for this visit.   General: No apparent distress alert and oriented x3 mood and affect normal, dressed appropriately.  HEENT: Pupils equal, extraocular movements intact  Respiratory: Patient's speak in full sentences and does not appear short of breath  Cardiovascular: No lower extremity edema, non tender, no erythema  Neuro: Cranial nerves II through XII are intact, neurovascularly intact in all extremities with 2+ DTRs and 2+ pulses.  Gait normal with good balance and coordination.  MSK:  Non tender with full range of motion and good stability and symmetric strength and tone of shoulders, elbows,  wrist, hip, knee and ankles bilaterally.  Back - Normal skin, Spine with normal alignment and no deformity.  No tenderness to vertebral process palpation.  Paraspinous muscles are not tender and without spasm.   Range of motion is full at neck and lumbar sacral regions  Osteopathic findings  C2 flexed rotated and side bent right C6 flexed rotated and side bent left T3 extended rotated and side bent right inhaled rib T9 extended rotated and side bent left L2 flexed rotated and side bent right Sacrum right on right       Assessment and Plan:    Nonallopathic problems  Decision today to treat with OMT was based on Physical Exam  After verbal consent patient was treated with HVLA, ME, FPR techniques in cervical, rib, thoracic, lumbar, and sacral  areas  Patient tolerated the procedure well with improvement in symptoms  Patient given exercises, stretches and lifestyle modifications  See medications in patient instructions if given  Patient will follow up in 4-8 weeks      The above documentation has been reviewed and is accurate and complete Jacqualin Combes       Note: This dictation was prepared with Dragon dictation along with smaller phrase technology. Any transcriptional errors that result from this process are unintentional.

## 2020-10-12 ENCOUNTER — Ambulatory Visit: Payer: Commercial Managed Care - PPO | Admitting: Family Medicine

## 2020-10-23 NOTE — Progress Notes (Signed)
[facility] [employer] [place] Phone: [phone] Subjective:   [name], am serving as a scribe for Dr. Hulan Saas. This visit occurred during the SARS-CoV-2 public health emergency.  Safety protocols were in place, including screening questions prior to the visit, additional usage of staff PPE, and extensive cleaning of exam room while observing appropriate contact time as indicated for disinfecting solutions.   I'm seeing this patient by the request  of:  [name], DO  CC: Low back pain and hip pain follow-up  [id number]  Eliceo Gladu is a 50 y.o. male coming in with complaint of back and neck pain. OMT 08/31/2020. Patient states that he is doing well. Is working out and trying to use good form.   Medications patient has been prescribed: Vit D  Taking: Yes         Reviewed prior external information including notes and imaging from previsou exam, outside providers and external EMR if available.   As well as notes that were available from care everywhere and other healthcare systems.  Past medical history, social, surgical and family history all reviewed in electronic medical record.  No pertanent information unless stated regarding to the chief complaint.   Past Medical History:  Diagnosis Date  . Arthritis   . Diabetes mellitus without complication ([place])   . Gout   . Hyperlipidemia   . Hypertension     No Known Allergies   Review of Systems:  No headache, visual changes, nausea, vomiting, diarrhea, constipation, dizziness, abdominal pain, skin rash, fevers, chills, night sweats, weight loss, swollen lymph nodes, body aches, joint swelling, chest pain, shortness of breath, mood changes. POSITIVE muscle aches  Objective  Blood pressure 120/72, pulse (!) 105, height 6' (1.829 m), weight 265 lb (120.2 kg), SpO2 97 %.   General: No apparent distress alert and oriented x3 mood and affect  normal, dressed appropriately.  HEENT: Pupils equal, extraocular movements intact  Respiratory: Patient's speak in full sentences and does not appear short of breath  Cardiovascular: No lower extremity edema, non tender, no erythema  Gait normal with good balance and coordination.   Back -low back exam does have some loss of lordosis.  Some tenderness to palpation in the paraspinal musculature lumbar spine right greater than left. Limited range of motion of the right hip still noted. Hip abductor strength is symmetric.  Neurovascularly intact distally.  Osteopathic findings  T5 extended rotated and side bent left L3 flexed rotated and side bent left Sacrum right on right       Assessment and Plan:  Lumbar radiculopathy Patient is doing remarkably well overall.  Continue to lose weight.  Discussed with patient on posture and alignment.  Patient will continue with more of the low impact exercising.  Follow-up with me again 4 to 6 weeks    Nonallopathic problems  Decision today to treat with OMT was based on Physical Exam  After verbal consent patient was treated with HVLA, ME, FPR techniques in  thoracic, lumbar, and sacral  areas  Patient tolerated the procedure well with improvement in symptoms  Patient given exercises, stretches and lifestyle modifications  See medications in patient instructions if given  Patient will follow up in 4-8 weeks     The above documentation has been reviewed and is accurate and complete [name], DO       Note: This dictation was prepared with Dragon dictation along with smaller phrase technology.  Any transcriptional errors that result from this process are unintentional.

## 2020-10-24 ENCOUNTER — Encounter: Payer: Self-pay | Admitting: Family Medicine

## 2020-10-24 ENCOUNTER — Other Ambulatory Visit: Payer: Self-pay

## 2020-10-24 ENCOUNTER — Ambulatory Visit (INDEPENDENT_AMBULATORY_CARE_PROVIDER_SITE_OTHER): Payer: Commercial Managed Care - PPO | Admitting: Family Medicine

## 2020-10-24 VITALS — BP 120/72 | HR 105 | Ht 72.0 in | Wt 265.0 lb

## 2020-10-24 DIAGNOSIS — M5416 Radiculopathy, lumbar region: Secondary | ICD-10-CM

## 2020-10-24 DIAGNOSIS — M999 Biomechanical lesion, unspecified: Secondary | ICD-10-CM | POA: Diagnosis not present

## 2020-10-24 NOTE — Assessment & Plan Note (Signed)
Patient is doing remarkably well overall.  Continue to lose weight.  Discussed with patient on posture and alignment.  Patient will continue with more of the low impact exercising.  Follow-up with me again 4 to 6 weeks

## 2020-10-24 NOTE — Patient Instructions (Signed)
Good to see you Keep working out be proud of weight loss See me again in 6-8 weeks

## 2020-10-26 ENCOUNTER — Other Ambulatory Visit: Payer: Self-pay | Admitting: Family Medicine

## 2020-10-26 DIAGNOSIS — E669 Obesity, unspecified: Secondary | ICD-10-CM

## 2020-10-26 DIAGNOSIS — E1169 Type 2 diabetes mellitus with other specified complication: Secondary | ICD-10-CM

## 2020-12-19 NOTE — Progress Notes (Signed)
[facility] [place] Phone: [phone] Subjective:   [name], am serving as a scribe for Dr. Hulan Saas. This visit occurred during the SARS-CoV-2 public health emergency.  Safety protocols were in place, including screening questions prior to the visit, additional usage of staff PPE, and extensive cleaning of exam room while observing appropriate contact time as indicated for disinfecting solutions.   I'm seeing this patient by the request  of:  [name], DO  CC: Low back pain, hip pain follow-up  [id number]  Zev Blue is a 50 y.o. male coming in with complaint of back and neck pain. OMT 10/24/2020. Patient states that he has had some numbness in both legs over the quads. walking long distances and standing for prolonged periods increases his symptoms. Patient notes feeling unstable due to numbness.  Patient feels like the frequency is increasing.  Patient is concerned because it may be affecting daily activities.  Has been working out on a more frequent basis recently.  Medications patient has been prescribed: None          Reviewed prior external information including notes and imaging from previsou exam, outside providers and external EMR if available.   As well as notes that were available from care everywhere and other healthcare systems.  Past medical history, social, surgical and family history all reviewed in electronic medical record.  No pertanent information unless stated regarding to the chief complaint.   Past Medical History:  Diagnosis Date  . Arthritis   . Diabetes mellitus without complication ([place])   . Gout   . Hyperlipidemia   . Hypertension     No Known Allergies   Review of Systems:  No headache, visual changes, nausea, vomiting, diarrhea, constipation, dizziness, abdominal pain, skin rash, fevers, chills, night sweats, weight loss, swollen lymph nodes, body  aches, joint swelling, chest pain, shortness of breath, mood changes. POSITIVE muscle aches  Objective  Blood pressure 112/74, pulse 98, height 6' (1.829 m), weight 263 lb (119.3 kg), SpO2 97 %.   General: No apparent distress alert and oriented x3 mood and affect normal, dressed appropriately.  HEENT: Pupils equal, extraocular movements intact  Respiratory: Patient's speak in full sentences and does not appear short of breath  Cardiovascular: No lower extremity edema, non tender, no erythema  Back exam does have some mild loss of lordosis.  Some worsening pain with extension of the back.  Patient does have some limited flexion of the back as well.  Patient does have significant tightness of the right hip with decreased range of motion in internal and external range of motion.  Osteopathic findings  T9 extended rotated and side bent left L1 flexed rotated and side bent right Sacrum right on right       Assessment and Plan:  Lumbar radiculopathy worsening symptom, bilateral legs now, has hx of DDD and possible nerve impingement. Will get xray today, prednisone and avoid NSAIDs while on med, Worsening symptoms will need MRI  RTC in 4 weeks      Nonallopathic problems  Decision today to treat with OMT was based on Physical Exam  After verbal consent patient was treated with HVLA, ME, FPR techniques in  thoracic, lumbar, and sacral  areas  Patient tolerated the procedure well with improvement in symptoms  Patient given exercises, stretches and lifestyle modifications  See medications in patient instructions if given  Patient will follow up in 4-8 weeks  The above documentation has been reviewed and is accurate and complete [name], DO       Note: This dictation was prepared with Dragon dictation along with smaller phrase technology. Any transcriptional errors that result from this process are unintentional.

## 2020-12-20 ENCOUNTER — Encounter: Payer: Self-pay | Admitting: Family Medicine

## 2020-12-20 ENCOUNTER — Ambulatory Visit (INDEPENDENT_AMBULATORY_CARE_PROVIDER_SITE_OTHER): Payer: Commercial Managed Care - PPO | Admitting: Family Medicine

## 2020-12-20 ENCOUNTER — Ambulatory Visit (INDEPENDENT_AMBULATORY_CARE_PROVIDER_SITE_OTHER): Payer: Commercial Managed Care - PPO

## 2020-12-20 ENCOUNTER — Other Ambulatory Visit: Payer: Self-pay

## 2020-12-20 VITALS — BP 112/74 | HR 98 | Ht 72.0 in | Wt 263.0 lb

## 2020-12-20 DIAGNOSIS — M9904 Segmental and somatic dysfunction of sacral region: Secondary | ICD-10-CM

## 2020-12-20 DIAGNOSIS — G8929 Other chronic pain: Secondary | ICD-10-CM

## 2020-12-20 DIAGNOSIS — M9903 Segmental and somatic dysfunction of lumbar region: Secondary | ICD-10-CM

## 2020-12-20 DIAGNOSIS — M545 Low back pain, unspecified: Secondary | ICD-10-CM

## 2020-12-20 DIAGNOSIS — M9902 Segmental and somatic dysfunction of thoracic region: Secondary | ICD-10-CM

## 2020-12-20 DIAGNOSIS — M5416 Radiculopathy, lumbar region: Secondary | ICD-10-CM

## 2020-12-20 MED ORDER — PREDNISONE 20 MG PO TABS: 20.0000 mg | ORAL_TABLET | Freq: Every day | ORAL | 0 refills | Status: DC

## 2020-12-20 NOTE — Assessment & Plan Note (Signed)
worsening symptom, bilateral legs now, has hx of DDD and possible nerve impingement. Will get xray today, prednisone and avoid NSAIDs while on med, Worsening symptoms will need MRI  RTC in 4 weeks

## 2020-12-20 NOTE — Patient Instructions (Addendum)
20 mg of prednisone for 5 days Back xray today If worsening symptoms or weakness write [place] we will get MRI  See me again 4 weeks

## 2020-12-22 ENCOUNTER — Encounter: Payer: Self-pay | Admitting: Family Medicine

## 2021-01-16 NOTE — Progress Notes (Signed)
Jesse [name] Jesse [name] Jesse [name] Jesse [name] Jesse [name] Phone: [phone] Subjective:   Jesse [name], am serving as a scribe for Dr. Hulan [name].  This visit occurred during the SARS-CoV-2 public health emergency.  Safety protocols were in place, including screening questions prior to the visit, additional usage of staff PPE, and extensive cleaning of exam room while observing appropriate contact time as indicated for disinfecting solutions.  I'm seeing this patient by the request  of:  Jesse Pal, DO  CC: Low back and hip pain follow-up  [id number]  Jesse [name] is a 50 y.o. male coming in with complaint of back and neck pain. OMT 12/20/2020.  Patient at last exam was having more of an exacerbation of some of the back pain and hip pain.  Patient states that prednisone did help his pain.  Patient does continue to try to lose weight.  Patient is down another 4 pounds from previous exam.  Medications patient has been prescribed: Gabapentin  Taking:   Patient did have x-rays at last exam December 21, 2020.  These were independently visualized by me showing the patient does have osteoarthritic changes in multiple levels of the lumbar spine with spondylolisthesis at L2-L3 and L3-L4  Patient has had 1 right-sided epidural at L3-L4 in 2019.  MRI of the right hip with contrast in 2019 showed patient did have a degenerative labral tear as well as moderately advanced osteoarthritic changes right greater than left.   Reviewed prior external information including notes and imaging from previsou exam, outside providers and external EMR if available.   As well as notes that were available from care everywhere and other healthcare systems.  Past medical history, social, surgical and family history all reviewed in electronic medical record.  [name] pertanent information unless stated regarding to the chief complaint.   Past Medical History:  Diagnosis Date    Arthritis    Diabetes mellitus without complication (HCC)    Gout    Hyperlipidemia    Hypertension     [name] Known Allergies   Review of Systems:  [name] headache, visual changes, nausea, vomiting, diarrhea, constipation, dizziness, abdominal pain, skin rash, fevers, chills, night sweats, weight loss, swollen lymph nodes,  joint swelling, chest pain, shortness of breath, mood changes. POSITIVE muscle aches, body aches  Objective  Blood pressure 130/66, pulse 99, height 6' (1.829 m), weight 259 lb (117.5 kg), SpO2 98 %.   General: [name] apparent distress alert and oriented x3 mood and affect normal, dressed appropriately.  HEENT: Pupils equal, extraocular movements intact  Respiratory: Patient's speak in full sentences and does not appear short of breath  Cardiovascular: [name] lower extremity edema, non tender, [name] erythema  Low back exam shows Continues to have decreased range of motion of the right hip with internal rotation.  Patient does have more tightness noted in the thoracolumbar juncture than usual.  Seems to be more on the right than the left.  Osteopathic findings  T9 extended rotated and side bent left L1 flexed rotated and side bent right Sacrum right on right       Assessment and Plan:  Lumbar radiculopathy Patient is doing a little better overall.  Still has some tightness noted.  Discussed with patient icing regimen and home exercises, increase activity slowly.  Follow-up with me again in 6 to 8 weeks otherwise   Nonallopathic problems  Decision today to treat with OMT was based on Physical Exam  After verbal consent patient was  treated with HVLA, ME, FPR techniques in  thoracic, lumbar, and sacral  areas  Patient tolerated the procedure well with improvement in symptoms  Patient given exercises, stretches and lifestyle modifications  See medications in patient instructions if given  Patient will follow up in 4-8 weeks      The above documentation has been  reviewed and is accurate and complete Jesse Pulley, DO        Note: This dictation was prepared with Dragon dictation along with smaller phrase technology. Any transcriptional errors that result from this process are unintentional.

## 2021-01-17 ENCOUNTER — Ambulatory Visit (INDEPENDENT_AMBULATORY_CARE_PROVIDER_SITE_OTHER): Payer: Commercial Managed Care - PPO | Admitting: Family Medicine

## 2021-01-17 ENCOUNTER — Encounter: Payer: Self-pay | Admitting: Family Medicine

## 2021-01-17 ENCOUNTER — Other Ambulatory Visit: Payer: Self-pay

## 2021-01-17 VITALS — BP 130/66 | HR 99 | Ht 72.0 in | Wt 259.0 lb

## 2021-01-17 DIAGNOSIS — M9903 Segmental and somatic dysfunction of lumbar region: Secondary | ICD-10-CM

## 2021-01-17 DIAGNOSIS — M5416 Radiculopathy, lumbar region: Secondary | ICD-10-CM | POA: Diagnosis not present

## 2021-01-17 DIAGNOSIS — M9904 Segmental and somatic dysfunction of sacral region: Secondary | ICD-10-CM

## 2021-01-17 DIAGNOSIS — M9902 Segmental and somatic dysfunction of thoracic region: Secondary | ICD-10-CM | POA: Diagnosis not present

## 2021-01-17 NOTE — Patient Instructions (Signed)
Good to see you Have fun hiking Stay hydrated and stretch afterwards See me in 5-6 weeks

## 2021-01-17 NOTE — Assessment & Plan Note (Signed)
Patient is doing a little better overall.  Still has some tightness noted.  Discussed with patient icing regimen and home exercises, increase activity slowly.  Follow-up with me again in 6 to 8 weeks otherwise

## 2021-02-26 ENCOUNTER — Other Ambulatory Visit: Payer: Self-pay | Admitting: Family Medicine

## 2021-02-26 DIAGNOSIS — I10 Essential (primary) hypertension: Secondary | ICD-10-CM

## 2021-02-28 ENCOUNTER — Encounter: Payer: Self-pay | Admitting: Family Medicine

## 2021-02-28 ENCOUNTER — Ambulatory Visit (INDEPENDENT_AMBULATORY_CARE_PROVIDER_SITE_OTHER): Payer: Commercial Managed Care - PPO | Admitting: Family Medicine

## 2021-02-28 ENCOUNTER — Other Ambulatory Visit: Payer: Self-pay

## 2021-02-28 VITALS — BP 120/70 | HR 88 | Ht 72.0 in | Wt 265.0 lb

## 2021-02-28 DIAGNOSIS — M9903 Segmental and somatic dysfunction of lumbar region: Secondary | ICD-10-CM | POA: Diagnosis not present

## 2021-02-28 DIAGNOSIS — M5416 Radiculopathy, lumbar region: Secondary | ICD-10-CM

## 2021-02-28 DIAGNOSIS — M9904 Segmental and somatic dysfunction of sacral region: Secondary | ICD-10-CM | POA: Diagnosis not present

## 2021-02-28 DIAGNOSIS — M1611 Unilateral primary osteoarthritis, right hip: Secondary | ICD-10-CM

## 2021-02-28 DIAGNOSIS — M9902 Segmental and somatic dysfunction of thoracic region: Secondary | ICD-10-CM | POA: Diagnosis not present

## 2021-02-28 MED ORDER — TIZANIDINE HCL 4 MG PO CAPS: ORAL_CAPSULE | ORAL | 0 refills | Status: DC

## 2021-02-28 NOTE — Assessment & Plan Note (Signed)
Patient continues to have intermittent radicular symptoms.  Concern for more of the spinal stenosis potentially causing some of the discomfort and pain.  Patient has had only 1 epidural in the back in 2019.  We discussed we could consider the possibility of repeating this.  May need repeating imaging now as well.  Patient was to continue with the conservative therapy at this point.  Follow-up with me again 6 to 8 weeks

## 2021-02-28 NOTE — Progress Notes (Signed)
[name] [place] Phone: [phone] Subjective:   Jesse [name], am serving as a scribe for Dr. Hulan Saas.  I'm seeing this patient by the request  of:  [name], DO  CC: back and hip pain   [id number]  Jesse [name] is a 50 y.o. male coming in with complaint of back and neck pain. OMT 01/17/2021. Patient states still getting numbness in his legs but back has been okay.  Patient sometimes feels like he has some instability of the leg.  Stands for long amount of time.  Patient was hiking the other day and did relatively well.  Attempted Tylenol.  Continue gabapentin as well as the Zanaflex  Medications patient has been prescribed: None  Taking:         Reviewed prior external information including notes and imaging from previsou exam, outside providers and external EMR if available.   As well as notes that were available from care everywhere and other healthcare systems.  Past medical history, social, surgical and family history all reviewed in electronic medical record.  No pertanent information unless stated regarding to the chief complaint.   Past Medical History:  Diagnosis Date   Arthritis    Diabetes mellitus without complication (HCC)    Gout    Hyperlipidemia    Hypertension     No Known Allergies   Review of Systems:  No headache, visual changes, nausea, vomiting, diarrhea, constipation, dizziness, abdominal pain, skin rash, fevers, chills, night sweats, weight loss, swollen lymph nodes, body aches, joint swelling, chest pain, shortness of breath, mood changes. POSITIVE muscle aches  Objective  Blood pressure 120/70, pulse 88, height 6' (1.829 m), weight 265 lb (120.2 kg), SpO2 98 %.   General: No apparent distress alert and oriented x3 mood and affect normal, dressed appropriately.  HEENT: Pupils equal, extraocular movements intact  Respiratory: Patient's speak in full  sentences and does not appear short of breath  Cardiovascular: No lower extremity edema, non tender, no erythema  Back exam shows loss of lordosis tightness noted from the thoracolumbar junction down to the sacroiliac joint. Right hip still shows decrease range of motion   Osteopathic findings   T9 extended rotated and side bent left L1 flexed rotated and side bent right Sacrum right on right       Assessment and Plan:  Lumbar radiculopathy Patient continues to have intermittent radicular symptoms.  Concern for more of the spinal stenosis potentially causing some of the discomfort and pain.  Patient has had only 1 epidural in the back in 2019.  We discussed we could consider the possibility of repeating this.  May need repeating imaging now as well.  Patient was to continue with the conservative therapy at this point.  Follow-up with me again 6 to 8 weeks  Arthritis of right hip Home to continue to monitor.  Patient does have arthritic changes.  No significant changes in management at this time now.   Nonallopathic problems  Decision today to treat with OMT was based on Physical Exam  After verbal consent patient was treated with HVLA, ME, FPR techniques in cervical, rib, thoracic, lumbar, and sacral  areas  Patient tolerated the procedure well with improvement in symptoms  Patient given exercises, stretches and lifestyle modifications  See medications in patient instructions if given  Patient will follow up in 4-8 weeks      The above documentation has been reviewed and is accurate  and complete [name], DO       Note: This dictation was prepared with Dragon dictation along with smaller phrase technology. Any transcriptional errors that result from this process are unintentional.

## 2021-02-28 NOTE — Patient Instructions (Addendum)
Good to see you  Refill sent to pharmacy Try to keep pushing yourself with activities and exercises See me again in 6-8 weeks

## 2021-02-28 NOTE — Assessment & Plan Note (Signed)
Home to continue to monitor.  Patient does have arthritic changes.  No significant changes in management at this time now.

## 2021-03-20 ENCOUNTER — Other Ambulatory Visit: Payer: Self-pay | Admitting: Family Medicine

## 2021-03-20 NOTE — Telephone Encounter (Signed)
Last OV--09/20/2020 Last RF--09/20/2020--#30 with 1 refill

## 2021-03-23 ENCOUNTER — Ambulatory Visit (INDEPENDENT_AMBULATORY_CARE_PROVIDER_SITE_OTHER): Payer: Commercial Managed Care - PPO | Admitting: Family Medicine

## 2021-03-23 ENCOUNTER — Encounter: Payer: Self-pay | Admitting: Family Medicine

## 2021-03-23 ENCOUNTER — Other Ambulatory Visit: Payer: Self-pay

## 2021-03-23 VITALS — BP 122/70 | HR 87 | Temp 98.3°F | Ht 72.0 in | Wt 262.0 lb

## 2021-03-23 DIAGNOSIS — Z1211 Encounter for screening for malignant neoplasm of colon: Secondary | ICD-10-CM | POA: Diagnosis not present

## 2021-03-23 DIAGNOSIS — E1169 Type 2 diabetes mellitus with other specified complication: Secondary | ICD-10-CM | POA: Diagnosis not present

## 2021-03-23 DIAGNOSIS — M1A9XX0 Chronic gout, unspecified, without tophus (tophi): Secondary | ICD-10-CM

## 2021-03-23 DIAGNOSIS — I10 Essential (primary) hypertension: Secondary | ICD-10-CM

## 2021-03-23 DIAGNOSIS — E669 Obesity, unspecified: Secondary | ICD-10-CM

## 2021-03-23 LAB — COMPREHENSIVE METABOLIC PANEL
ALT: 17 U/L (ref 0–53)
AST: 28 U/L (ref 0–37)
Albumin: 4.6 g/dL (ref 3.5–5.2)
Alkaline Phosphatase: 42 U/L (ref 39–117)
BUN: 14 mg/dL (ref 6–23)
CO2: 31 mEq/L (ref 19–32)
Calcium: 10 mg/dL (ref 8.4–10.5)
Chloride: 100 mEq/L (ref 96–112)
Creatinine, Ser: 1.21 mg/dL (ref 0.40–1.50)
GFR: 70.21 mL/min (ref >60.00)
Glucose, Bld: 128 mg/dL — ABNORMAL HIGH (ref 70–99)
Potassium: 3.8 mEq/L (ref 3.5–5.1)
Sodium: 141 mEq/L (ref 135–145)
Total Bilirubin: 0.6 mg/dL (ref 0.2–1.2)
Total Protein: 7.3 g/dL (ref 6.0–8.3)

## 2021-03-23 LAB — LIPID PANEL
Cholesterol: 146 mg/dL (ref 0–200)
HDL: 30.7 mg/dL — ABNORMAL LOW (ref >39.00)
NonHDL: 115.71
Total CHOL/HDL Ratio: 5
Triglycerides: 318 mg/dL — ABNORMAL HIGH (ref 0.0–149.0)
VLDL: 63.6 mg/dL — ABNORMAL HIGH (ref 0.0–40.0)

## 2021-03-23 LAB — URIC ACID: Uric Acid, Serum: 5.7 mg/dL (ref 4.0–7.8)

## 2021-03-23 LAB — LDL CHOLESTEROL, DIRECT: Direct LDL: 77 mg/dL

## 2021-03-23 LAB — HEMOGLOBIN A1C: Hgb A1c MFr Bld: 6.1 % (ref 4.6–6.5)

## 2021-03-23 MED ORDER — COLCHICINE 0.6 MG PO TABS: ORAL_TABLET | ORAL | 2 refills | Status: DC

## 2021-03-23 NOTE — Patient Instructions (Addendum)
Give [place] 2-3 business days to get the results of your labs back.   Keep the diet clean and stay active.  I recommend getting the flu shot in mid October. This suggestion would change if the CDC comes out with a different recommendation.   If you do not hear anything about your referral in the next 1-2 weeks, call our office and ask for an update.  Please schedule your eye exam.   Let us know if you need anything.

## 2021-03-23 NOTE — Progress Notes (Signed)
Chief Complaint  Patient presents with   Follow-up    6 month    Jesse [name] is a 50 y.o. male here for gout.  Currently being treated with Allopurinol 300 mg/d. The joint(s) affected include: knee/toe Reports compliance. Side effects of medications: None Is avoiding seafood, sweet/sugary beverages, alcohol, and red meats.  Hypertension Patient presents for hypertension follow up. He does not monitor home blood pressures. He is compliant with medications- Zestoretic 20-12.5 mg/d. Patient has these side effects of medication: none He is adhering to a healthy diet overall. Exercise: cardio, lifting weights No CP or SOB.  Diabetes Mellitus Type 2 Takes Ozempic 1 mg/week, does not do well with metformin. Has not been checking sugars at home. Diet/exercise as above. No lows.  He is compliant with Lipitor 40 mg daily.  He is lost around 30 pounds over the past year.  Past Medical History:  Diagnosis Date   Arthritis    Diabetes mellitus without complication (HCC)    Gout    Hyperlipidemia    Hypertension     BP 122/70   Pulse 87   Temp 98.3 F (36.8 C) (Oral)   Ht 6' (1.829 m)   Wt 262 lb (118.8 kg)   SpO2 98%   BMI 35.53 kg/m  Gen: Awake, alert, appears stated age Neck: No masses or asymmetry Heart: RRR, no murmurs, no bruits, no LE edema Lungs: CTAB, no accessory muscle use, DP pulses 2+ bilaterally MSK: no swelling or TTP, normal gait Skin: No erythema or warmth Neuro: Sensation intact to pinprick b/l feet Psych: Age appropriate judgment and insight, nml mood and affect  Chronic gout without tophus, unspecified cause, unspecified site - Plan: Uric acid  Essential hypertension  Diabetes mellitus type 2 in obese ([place]) - Plan: Comprehensive metabolic panel, Lipid panel, Hemoglobin A1c  Screen for colon cancer - Plan: Ambulatory referral to Gastroenterology  Chronic, stable.  Continue allopurinol 300 mg daily.  Colchicine as needed refilled.  Reminded to avoid  foods like alcohol, sweet beverages, red meat, lunch meat, sea food. Chronic, stable.  Continue Zestoretic 20-12.5 mg daily. Chronic, stable.  Continue Ozempic 1 mg weekly.  Check labs today.  Continue Lipitor 40 mg daily. F/u in 6 months for physical. The patient voiced understanding and agreement to the plan.  [place], DO 03/23/21 11:32 AM

## 2021-04-24 NOTE — Progress Notes (Signed)
[facility] [place] Phone: [phone] Subjective:   Jesse [name], am serving as a scribe for Dr. Hulan Saas.  This visit occurred during the SARS-CoV-2 public health emergency.  Safety protocols were in place, including screening questions prior to the visit, additional usage of staff PPE, and extensive cleaning of exam room while observing appropriate contact time as indicated for disinfecting solutions.   I'm seeing this patient by the request  of:  [name], DO  CC: Neck and back pain follow-up  [id number]  Jesse [name] is a 50 y.o. male coming in with complaint of back and neck pain. OMT 02/28/2021. Patient states that his back pain increased over past 2 weeks. Pain and tingling down lateral aspect of leg to ankle. This has subsided  recently. Patient is now taking gabapentin again and Meloxicam and within a day his pain was gone. Zanaflex prn.   Medications patient has been prescribed:  Zanaflex, Gabapentin, Meloxicam         Reviewed prior external information including notes and imaging from previsou exam, outside providers and external EMR if available.   As well as notes that were available from care everywhere and other healthcare systems.  Past medical history, social, surgical and family history all reviewed in electronic medical record.  [name] pertanent information unless stated regarding to the chief complaint.   Past Medical History:  Diagnosis Date   Arthritis    Diabetes mellitus without complication (HCC)    Gout    Hyperlipidemia    Hypertension     [name] Known Allergies   Review of Systems:  [name] headache, visual changes, nausea, vomiting, diarrhea, constipation, dizziness, abdominal pain, skin rash, fevers, chills, night sweats, weight loss, swollen lymph nodes, body aches, joint swelling, chest pain, shortness of breath, mood changes. POSITIVE muscle aches  Objective   Blood pressure 122/78, pulse 76, height 6' (1.829 m), weight 267 lb (121.1 kg), SpO2 97 %.   General: [name] apparent distress alert and oriented x3 mood and affect normal, dressed appropriately.  HEENT: Pupils equal, extraocular movements intact  Respiratory: Patient's speak in full sentences and does not appear short of breath  Cardiovascular: [name] lower extremity edema, non tender, [name] erythema  Neck exam shows [name] loss of lordosis. Low back exam though does show tightness noted in the paraspinal musculature of the lumbar spine and actually little more left than right.  Still tightness of the right hip flexor noted and still some limited range of motion of the right hip.  Osteopathic findings   T9 extended rotated and side bent left L2 flexed rotated and side bent right L4 flexed rotated and side bent left Sacrum right on right       Assessment and Plan:  Lumbar radiculopathy Chronic problem with mild exacerbation.  Patient has had some intermittent radicular symptoms now on the left side as well.  Encourage patient to restart the gabapentin on a more regular basis.  Discussed icing regimen and home exercises.  Discussed which activities to do which wants to avoid.  Increase activity slowly.  Follow-up with me again in 6 to 8 weeks.   Nonallopathic problems  Decision today to treat with OMT was based on Physical Exam  After verbal consent patient was treated with HVLA, ME, FPR techniques in  thoracic, lumbar, and sacral  areas  Patient tolerated the procedure well with improvement in symptoms  Patient given exercises, stretches and lifestyle modifications  See medications in patient instructions if given  Patient will follow up in 6-8 weeks     The above documentation has been reviewed and is accurate and complete [name], DO        Note: This dictation was prepared with Dragon dictation along with smaller phrase technology. Any transcriptional errors that result  from this process are unintentional.

## 2021-04-25 ENCOUNTER — Ambulatory Visit (INDEPENDENT_AMBULATORY_CARE_PROVIDER_SITE_OTHER): Payer: Commercial Managed Care - PPO | Admitting: Family Medicine

## 2021-04-25 ENCOUNTER — Other Ambulatory Visit: Payer: Self-pay

## 2021-04-25 ENCOUNTER — Other Ambulatory Visit: Payer: Self-pay | Admitting: Family Medicine

## 2021-04-25 ENCOUNTER — Encounter: Payer: Self-pay | Admitting: Family Medicine

## 2021-04-25 VITALS — BP 122/78 | HR 76 | Ht 72.0 in | Wt 267.0 lb

## 2021-04-25 DIAGNOSIS — M9902 Segmental and somatic dysfunction of thoracic region: Secondary | ICD-10-CM | POA: Diagnosis not present

## 2021-04-25 DIAGNOSIS — M9904 Segmental and somatic dysfunction of sacral region: Secondary | ICD-10-CM | POA: Diagnosis not present

## 2021-04-25 DIAGNOSIS — M5416 Radiculopathy, lumbar region: Secondary | ICD-10-CM

## 2021-04-25 DIAGNOSIS — M9903 Segmental and somatic dysfunction of lumbar region: Secondary | ICD-10-CM | POA: Diagnosis not present

## 2021-04-25 MED ORDER — GABAPENTIN 100 MG PO CAPS: 200.0000 mg | ORAL_CAPSULE | Freq: Every day | ORAL | 3 refills | Status: DC

## 2021-04-25 NOTE — Patient Instructions (Signed)
Good to see you! Take gabapentin a night will be a good idea Stretches after lots of activity See you again in 6 weeks after playoff push

## 2021-04-25 NOTE — Assessment & Plan Note (Signed)
Chronic problem with mild exacerbation.  Patient has had some intermittent radicular symptoms now on the left side as well.  Encourage patient to restart the gabapentin on a more regular basis.  Discussed icing regimen and home exercises.  Discussed which activities to do which wants to avoid.  Increase activity slowly.  Follow-up with me again in 6 to 8 weeks.

## 2021-05-18 ENCOUNTER — Other Ambulatory Visit: Payer: Self-pay | Admitting: Family Medicine

## 2021-05-18 DIAGNOSIS — E1169 Type 2 diabetes mellitus with other specified complication: Secondary | ICD-10-CM

## 2021-06-04 ENCOUNTER — Encounter: Payer: Self-pay | Admitting: *Deleted

## 2021-06-06 ENCOUNTER — Ambulatory Visit: Payer: Commercial Managed Care - PPO | Admitting: Family Medicine

## 2021-06-21 ENCOUNTER — Encounter: Payer: Self-pay | Admitting: Gastroenterology

## 2021-07-10 NOTE — Progress Notes (Deleted)
°  [place] Phone: [phone] Subjective:    I'm seeing this patient by the request  of:  Jesse Pal, DO  CC:   [id number]  Jesse [name] is a 50 y.o. male coming in with complaint of back and neck pain. OMT on 05/05/2021. Patient states   Medications patient has been prescribed: Gabapentin  Taking:         Reviewed prior external information including notes and imaging from previsou exam, outside providers and external EMR if available.   As well as notes that were available from care everywhere and other healthcare systems.  Past medical history, social, surgical and family history all reviewed in electronic medical record.  No pertanent information unless stated regarding to the chief complaint.   Past Medical History:  Diagnosis Date   Arthritis    Diabetes mellitus without complication (HCC)    Gout    Hyperlipidemia    Hypertension     No Known Allergies   Review of Systems:  No headache, visual changes, nausea, vomiting, diarrhea, constipation, dizziness, abdominal pain, skin rash, fevers, chills, night sweats, weight loss, swollen lymph nodes, body aches, joint swelling, chest pain, shortness of breath, mood changes. POSITIVE muscle aches  Objective  There were no vitals taken for this visit.   General: No apparent distress alert and oriented x3 mood and affect normal, dressed appropriately.  HEENT: Pupils equal, extraocular movements intact  Respiratory: Patient's speak in full sentences and does not appear short of breath  Cardiovascular: No lower extremity edema, non tender, no erythema  Neuro: Cranial nerves II through XII are intact, neurovascularly intact in all extremities with 2+ DTRs and 2+ pulses.  Gait normal with good balance and coordination.  MSK:  Non tender with full range of motion and good stability and symmetric strength and tone of shoulders, elbows,  wrist, hip, knee and ankles bilaterally.  Back - Normal skin, Spine with normal alignment and no deformity.  No tenderness to vertebral process palpation.  Paraspinous muscles are not tender and without spasm.   Range of motion is full at neck and lumbar sacral regions  Osteopathic findings  C2 flexed rotated and side bent right C6 flexed rotated and side bent left T3 extended rotated and side bent right inhaled rib T9 extended rotated and side bent left L2 flexed rotated and side bent right Sacrum right on right       Assessment and Plan:    Nonallopathic problems  Decision today to treat with OMT was based on Physical Exam  After verbal consent patient was treated with HVLA, ME, FPR techniques in cervical, rib, thoracic, lumbar, and sacral  areas  Patient tolerated the procedure well with improvement in symptoms  Patient given exercises, stretches and lifestyle modifications  See medications in patient instructions if given  Patient will follow up in 4-8 weeks      The above documentation has been reviewed and is accurate and complete Jesse [name]       Note: This dictation was prepared with Diplomatic Services operational officer dictation along with smaller Company secretary. Any transcriptional errors that result from this process are unintentional.

## 2021-07-11 ENCOUNTER — Ambulatory Visit: Payer: Commercial Managed Care - PPO | Admitting: Family Medicine

## 2021-08-20 ENCOUNTER — Ambulatory Visit (AMBULATORY_SURGERY_CENTER): Payer: Self-pay | Admitting: *Deleted

## 2021-08-20 ENCOUNTER — Other Ambulatory Visit: Payer: Self-pay

## 2021-08-20 VITALS — Ht 72.0 in | Wt 250.0 lb

## 2021-08-20 DIAGNOSIS — Z1211 Encounter for screening for malignant neoplasm of colon: Secondary | ICD-10-CM

## 2021-08-20 MED ORDER — NA SULFATE-K SULFATE-MG SULF 17.5-3.13-1.6 GM/177ML PO SOLN
1.0000 | Freq: Once | ORAL | 0 refills | Status: AC
Start: 2021-08-20 — End: 2021-08-20

## 2021-08-20 NOTE — Progress Notes (Signed)

## 2021-08-22 ENCOUNTER — Other Ambulatory Visit: Payer: Self-pay | Admitting: Family Medicine

## 2021-09-03 ENCOUNTER — Encounter: Payer: Commercial Managed Care - PPO | Admitting: Gastroenterology

## 2021-09-10 ENCOUNTER — Other Ambulatory Visit: Payer: Self-pay | Admitting: Family Medicine

## 2021-09-19 ENCOUNTER — Other Ambulatory Visit: Payer: Self-pay | Admitting: Family Medicine

## 2021-09-20 ENCOUNTER — Other Ambulatory Visit: Payer: Self-pay | Admitting: Family Medicine

## 2021-09-21 ENCOUNTER — Encounter: Payer: Self-pay | Admitting: Gastroenterology

## 2021-09-25 ENCOUNTER — Ambulatory Visit (AMBULATORY_SURGERY_CENTER): Payer: BC Managed Care – PPO | Admitting: Gastroenterology

## 2021-09-25 ENCOUNTER — Other Ambulatory Visit: Payer: Self-pay

## 2021-09-25 ENCOUNTER — Encounter: Payer: Self-pay | Admitting: Gastroenterology

## 2021-09-25 ENCOUNTER — Encounter: Payer: Commercial Managed Care - PPO | Admitting: Family Medicine

## 2021-09-25 VITALS — BP 119/72 | HR 69 | Temp 97.5°F | Resp 20 | Ht 72.0 in | Wt 250.0 lb

## 2021-09-25 DIAGNOSIS — D120 Benign neoplasm of cecum: Secondary | ICD-10-CM | POA: Diagnosis not present

## 2021-09-25 DIAGNOSIS — Z1211 Encounter for screening for malignant neoplasm of colon: Secondary | ICD-10-CM

## 2021-09-25 MED ORDER — SODIUM CHLORIDE 0.9 % IV SOLN: 500.0000 mL | Freq: Once | INTRAVENOUS | Status: DC

## 2021-09-25 NOTE — Progress Notes (Signed)
History and Physical: ? This patient presents for endoscopic testing for: ?Encounter Diagnosis  ?Name Primary?  ? Special screening for malignant neoplasms, colon Yes  ? ? ?First screening exam ?Patient denies chronic abdominal pain, rectal bleeding, constipation or diarrhea. ? ? ?ROS: ?Patient denies chest pain or shortness of breath ? ? ?Past Medical History: ?Past Medical History:  ?Diagnosis Date  ? Arthritis   ? Diabetes mellitus without complication ([place])   ? Gout   ? Hyperlipidemia   ? Hypertension   ? Sleep apnea   ? WEARS C PAP  ? ? ? ?Past Surgical History: ?Past Surgical History:  ?Procedure Laterality Date  ? BICEPS TENDON REPAIR Left   ? HERNIA REPAIR  2019  ? KNEE SURGERY Left 2018  ? ? ?Allergies: ?No Known Allergies ? ?Outpatient Meds: ?Current Outpatient Medications  ?Medication Sig Dispense Refill  ? acyclovir (ZOVIRAX) 400 MG tablet TAKE 1 TABLET TWICE A DAY 180 tablet 3  ? allopurinol (ZYLOPRIM) 300 MG tablet TAKE 1 TABLET DAILY 90 tablet 3  ? lisinopril-hydrochlorothiazide (ZESTORETIC) 20-12.5 MG tablet TAKE 1 TABLET DAILY 90 tablet 3  ? ALPRAZolam (XANAX) 0.5 MG tablet TAKE 1 TABLET BY MOUTH AT BEDTIME AS NEEDED FOR ANXIETY 30 tablet 4  ? atorvastatin (LIPITOR) 40 MG tablet TAKE 1 TABLET DAILY 90 tablet 3  ? buPROPion (WELLBUTRIN XL) 300 MG 24 hr tablet TAKE 1 TABLET DAILY 90 tablet 3  ? colchicine 0.6 MG tablet Take 2 tabs and another in 1 hour during a flare. Take daily after that until it resolves. 30 tablet 2  ? fenofibrate micronized (LOFIBRA) 200 MG capsule TAKE 1 CAPSULE DAILY BEFORE BREAKFAST 90 capsule 3  ? gabapentin (NEURONTIN) 100 MG capsule Take 2 capsules (200 mg total) by mouth at bedtime. 180 capsule 3  ? meloxicam (MOBIC) 15 MG tablet TAKE 1 TABLET DAILY 90 tablet 3  ? OZEMPIC, 1 MG/DOSE, 4 MG/3ML SOPN INJECT 1 MG UNDER THE SKIN ONCE A WEEK 9 mL 3  ? tiZANidine (ZANAFLEX) 4 MG capsule TAKE 1 CAPSULE BY MOUTH AT NIGHT (Patient not taking: Reported on 08/20/2021) 30 capsule 0  ?  Vitamin D, Ergocalciferol, (DRISDOL) 1.25 MG (50000 UNIT) CAPS capsule TAKE 1 CAPSULE EVERY 7 DAYS 12 capsule 3  ? ?Current Facility-Administered Medications  ?Medication Dose Route Frequency Provider Last Rate Last Admin  ? 0.9 %  sodium chloride infusion  500 mL Intravenous Once Doran Stabler, MD      ? ? ? ? ?___________________________________________________________________ ?Objective  ? ?Exam: ? ?BP 137/80   Pulse 68   Temp (!) 97.5 ?F (36.4 ?C) (Temporal)   Ht 6' (1.829 m)   Wt 250 lb (113.4 kg)   SpO2 97%   BMI 33.91 kg/m?  ? ?CV: RRR without murmur, S1/S2 ?Resp: clear to auscultation bilaterally, normal RR and effort noted ?GI: soft, no tenderness, with active bowel sounds. ? ? ?Assessment: ?Encounter Diagnosis  ?Name Primary?  ? Special screening for malignant neoplasms, colon Yes  ? ? ? ?Plan: ?Colonoscopy ? The benefits and risks of the planned procedure were described in detail with the patient or (when appropriate) their health care proxy.  Risks were outlined as including, but not limited to, bleeding, infection, perforation, adverse medication reaction leading to cardiac or pulmonary decompensation, pancreatitis (if ERCP).  The limitation of incomplete mucosal visualization was also discussed.  No guarantees or warranties were given. ? ? ? ?The patient is appropriate for an endoscopic procedure in the ambulatory setting. ? ? -  Wilfrid Lund, MD ? ? ? ? ?

## 2021-09-25 NOTE — Op Note (Signed)
[facility] ?Patient Name: Jesse [name] ?Procedure Date: 09/25/2021 10:53 AM ?MRN: [id number] ?Endoscopist: Estill Cotta. Loletha Carrow , MD ?Age: 51 ?Referring MD:  ?Date of Birth: [birth year] ?Gender: Male ?Account #: [id number] ?Procedure:                Colonoscopy ?Indications:              Screening for colorectal malignant neoplasm, This  ?                          is the patient's first colonoscopy ?Medicines:                Monitored Anesthesia Care ?Procedure:                Pre-Anesthesia Assessment: ?                          - Prior to the procedure, a History and Physical  ?                          was performed, and patient medications and  ?                          allergies were reviewed. The patient's tolerance of  ?                          previous anesthesia was also reviewed. The risks  ?                          and benefits of the procedure and the sedation  ?                          options and risks were discussed with the patient.  ?                          All questions were answered, and informed consent  ?                          was obtained. Prior Anticoagulants: The patient has  ?                          taken no previous anticoagulant or antiplatelet  ?                          agents. ASA Grade Assessment: II - A patient with  ?                          mild systemic disease. After reviewing the risks  ?                          and benefits, the patient was deemed in  ?                          satisfactory condition to undergo the procedure. ?  After obtaining informed consent, the colonoscope  ?                          was passed under direct vision. Throughout the  ?                          procedure, the patient's blood pressure, pulse, and  ?                          oxygen saturations were monitored continuously. The  ?                          CF [id number] #[id number] was introduced through the anus  ?                          and advanced to the the  terminal ileum, with  ?                          identification of the appendiceal orifice and IC  ?                          valve. The colonoscopy was performed without  ?                          difficulty. The patient tolerated the procedure  ?                          well. The quality of the bowel preparation was  ?                          excellent. The terminal ileum, ileocecal valve,  ?                          appendiceal orifice, and rectum were photographed. ?Scope In: 11:01:24 AM ?Scope Out: 11:22:58 AM ?Scope Withdrawal Time: 0 hours 19 minutes 2 seconds  ?Total Procedure Duration: 0 hours 21 minutes 34 seconds  ?Findings:                 The perianal and digital rectal examinations were  ?                          normal. ?                          The terminal ileum appeared normal. ?                          A 25 mm polyp was found in the ileocecal valve. The  ?                          polyp was multi-lobulated and sessile, difficult to  ?                          distinguish proximal aspect from ileal mucosa.  ?  Biopsies were taken with a cold forceps for  ?                          histology. ?                          A few diverticula were found in the left colon. ?                          The exam was otherwise without abnormality on  ?                          direct and retroflexion views. ?Complications:            No immediate complications. ?Estimated Blood Loss:     Estimated blood loss was minimal. ?Impression:               - The examined portion of the ileum was normal. ?                          - One 25 mm polyp at the ileocecal valve. Biopsied. ?                          - Diverticulosis in the left colon. ?                          - The examination was otherwise normal on direct  ?                          and retroflexion views. ?Recommendation:           - Patient has a contact number available for  ?                          emergencies. The signs  and symptoms of potential  ?                          delayed complications were discussed with the  ?                          patient. Return to normal activities tomorrow.  ?                          Written discharge instructions were provided to the  ?                          patient. ?                          - Resume previous diet. ?                          - Continue present medications. ?                          - Await pathology results. ?                          -  Review case with advanced endoscopist after  ?                          pathology results to determine if EMR feasible or  ?                          if surgical resection required. ?Bryse Blanchette L. Loletha Carrow, MD ?09/25/2021 11:35:07 AM ?This report has been signed electronically. ?

## 2021-09-25 NOTE — Progress Notes (Signed)
Vital signs checked by:DT ? ?The patient states no changes in medical or surgical history since pre-visit screening on 08/20/21. ? ? ?

## 2021-09-25 NOTE — Patient Instructions (Addendum)
Handout provided on polyps and diverticulosis.  ? ?YOU HAD AN ENDOSCOPIC PROCEDURE TODAY AT [facility]:   Refer to the procedure report that was given to you for any specific questions about what was found during the examination.  If the procedure report does not answer your questions, please call your gastroenterologist to clarify.  If you requested that your care partner not be given the details of your procedure findings, then the procedure report has been included in a sealed envelope for you to review at your convenience later. ? ?YOU SHOULD EXPECT: Some feelings of bloating in the abdomen. Passage of more gas than usual.  Walking can help get rid of the air that was put into your GI tract during the procedure and reduce the bloating. If you had a lower endoscopy (such as a colonoscopy or flexible sigmoidoscopy) you may notice spotting of blood in your stool or on the toilet paper. If you underwent a bowel prep for your procedure, you may not have a normal bowel movement for a few days. ? ?Please Note:  You might notice some irritation and congestion in your nose or some drainage.  This is from the oxygen used during your procedure.  There is no need for concern and it should clear up in a day or so. ? ?SYMPTOMS TO REPORT IMMEDIATELY: ? ?Following lower endoscopy (colonoscopy or flexible sigmoidoscopy): ? Excessive amounts of blood in the stool ? Significant tenderness or worsening of abdominal pains ? Swelling of the abdomen that is new, acute ? Fever of 100?F or higher ? ?For urgent or emergent issues, a gastroenterologist can be reached at any hour by calling [phone]. ?Do not use MyChart messaging for urgent concerns.  ? ? ?DIET:  We do recommend a small meal at first, but then you may proceed to your regular diet.  Drink plenty of fluids but you should avoid alcoholic beverages for 24 hours. ? ?ACTIVITY:  You should plan to take it easy for the rest of today and you should NOT DRIVE  or use heavy machinery until tomorrow (because of the sedation medicines used during the test).   ? ?FOLLOW UP: ?Our staff will call the number listed on your records 48-72 hours following your procedure to check on you and address any questions or concerns that you may have regarding the information given to you following your procedure. If we do not reach you, we will leave a message.  We will attempt to reach you two times.  During this call, we will ask if you have developed any symptoms of COVID 19. If you develop any symptoms (ie: fever, flu-like symptoms, shortness of breath, cough etc.) before then, please call [phone].  If you test positive for Covid 19 in the 2 weeks post procedure, please call and report this information to [place].   ? ?If any biopsies were taken you will be contacted by phone or by letter within the next 1-3 weeks.  Please call us at [phone] if you have not heard about the biopsies in 3 weeks.  ? ? ?SIGNATURES/CONFIDENTIALITY: ?You and/or your care partner have signed paperwork which will be entered into your electronic medical record.  These signatures attest to the fact that that the information above on your After Visit Summary has been reviewed and is understood.  Full responsibility of the confidentiality of this discharge information lies with you and/or your care-partner. ? ?

## 2021-09-25 NOTE — Progress Notes (Signed)
To pacu, VSS. Report to rn.tb °

## 2021-09-25 NOTE — Progress Notes (Signed)
Called to room to assist during endoscopic procedure.  Patient ID and intended procedure confirmed with present staff. Received instructions for my participation in the procedure from the performing physician.  

## 2021-09-27 ENCOUNTER — Telehealth: Payer: Self-pay | Admitting: *Deleted

## 2021-09-27 ENCOUNTER — Telehealth: Payer: Self-pay

## 2021-09-27 NOTE — Telephone Encounter (Signed)
?  Follow up Call- ? ?Call back number 09/25/2021  ?Post procedure Call Back phone  # [phone]  ?Permission to leave phone message Yes  ?Some recent data might be hidden  ?  ? ?Patient questions: ? ?Do you have a fever, pain , or abdominal swelling? No. ?Pain Score  0 * ? ?Have you tolerated food without any problems? Yes.   ? ?Have you been able to return to your normal activities? Yes.   ? ?Do you have any questions about your discharge instructions: ?Diet   No. ?Medications  No. ?Follow up visit  No. ? ?Do you have questions or concerns about your Care? No. ? ?Actions: ?* If pain score is 4 or above: ?No action needed, pain <4. ? ? ?Have you developed a fever since your procedure? no ? ?2.   Have you had an respiratory symptoms (SOB or cough) since your procedure? no ? ?3.   Have you tested positive for COVID 19 since your procedure? no  ? ?4.   Have you had any family members/close contacts diagnosed with the COVID 19 since your procedure?  no ? ? ?If yes to any of these questions please route to Joylene John, RN and Joella Prince, RN ? ? ? ?

## 2021-09-27 NOTE — Telephone Encounter (Signed)
?  Follow up Call- ? ?Call back number 09/25/2021  ?Post procedure Call Back phone  # [phone]  ?Permission to leave phone message Yes  ?Some recent data might be hidden  ?  ? ?Patient questions: ? ?Do you have a fever, pain , or abdominal swelling? No. ?Pain Score  0 * ? ?Have you tolerated food without any problems? Yes.   ? ?Have you been able to return to your normal activities? Yes.   ? ?Do you have any questions about your discharge instructions: ?Diet   No. ?Medications  No. ?Follow up visit  No. ? ?Do you have questions or concerns about your Care? No. ? ?Actions: ?* If pain score is 4 or above: ?No action needed, pain <4. ? ? ?

## 2021-09-28 ENCOUNTER — Encounter: Payer: Self-pay | Admitting: Gastroenterology

## 2021-10-03 ENCOUNTER — Telehealth: Payer: Self-pay

## 2021-10-03 NOTE — Telephone Encounter (Signed)
The pt has been scheduled for an office visit to discuss colon EMR.   ?

## 2021-10-03 NOTE — Telephone Encounter (Signed)
-----   Message from Irving Copas., MD sent at 09/28/2021  1:38 PM EST ----- ?HD, ?Sounds good. ?Alroy Portela, when you return please schedule this patient a clinic visit and if he is up for a colonoscopy with EMR 90-minute slot.  If he wants to wait to the clinic visit that is fine as well.  Okay to use a held slot or a 3:50 PM slot. ?Thanks. ?GM ?----- Message ----- ?From: Doran Stabler, MD ?Sent: 09/28/2021   6:20 AM EST ?To: Irving Copas., MD ? ?Thanks, Gabe. ? ?Pathology is tubular adenoma without HGD.  I will ask Madhav Mohon to contact him and offer a colonoscopy with you and prior office visit. ? ?HD ? ?

## 2021-10-29 ENCOUNTER — Other Ambulatory Visit: Payer: Self-pay | Admitting: Family Medicine

## 2021-10-29 DIAGNOSIS — E669 Obesity, unspecified: Secondary | ICD-10-CM

## 2021-10-29 NOTE — Progress Notes (Signed)
?  Charlann Boxer D.O. ?[facility] ?[place] ?Phone: [phone] ?Subjective:   ?I, Jesse [name], am serving as a scribe for Dr. Hulan Saas. ?I'm seeing this patient by the request  of:  Nani Ravens Crosby Oyster, DO ? ?CC: neck and back pain  ? ?[id number]  ?Jesse [name] is a 51 y.o. male coming in with complaint of back and neck pain> OMT 04/25/2022. Patient states back has been pretty good Right hip is still giving trouble.  ? ?Medications patient has been prescribed: Meloxicam ? ?Taking: Meloxicam  ? ? ?  ? ? ? ? ?Reviewed prior external information including notes and imaging from previsou exam, outside providers and external EMR if available.  ? ?As well as notes that were available from care everywhere and other healthcare systems. ? ?Past medical history, social, surgical and family history all reviewed in electronic medical record.  No pertanent information unless stated regarding to the chief complaint.  ? ?Past Medical History:  ?Diagnosis Date  ? Arthritis   ? Diabetes mellitus without complication ([place])   ? Gout   ? Hyperlipidemia   ? Hypertension   ? Sleep apnea   ? WEARS C PAP  ?  ?No Known Allergies ? ? ?Review of Systems: ? No headache, visual changes, nausea, vomiting, diarrhea, constipation, dizziness, abdominal pain, skin rash, fevers, chills, night sweats, weight loss, swollen lymph nodes, body aches, joint swelling, chest pain, shortness of breath, mood changes. POSITIVE muscle aches ? ?Objective  ?Blood pressure 130/80, pulse 87, height 6' (1.829 m), weight 268 lb (121.6 kg), SpO2 97 %. ?  ?General: No apparent distress alert and oriented x3 mood and affect normal, dressed appropriately.  ?HEENT: Pupils equal, extraocular movements intact  ?Respiratory: Patient's speak in full sentences and does not appear short of breath  ?Cardiovascular: No lower extremity edema, non tender, no erythema  ?Low back exam does have significant increase in tightness  noted.  Some of it seems to be more in the thoracolumbar juncture.  Does still have the sacroiliac joint discomfort seems to be more right greater than left.  Limited range of motion noted of the right hip noted as well. ? ?Osteopathic findings ? ? ?T9 extended rotated and side bent left ?L3 flexed rotated and side bent left ?Sacrum right on right ? ? ? ? ?  ?Assessment and Plan: ? ?Lumbar radiculopathy ?Chronic with exacerbation  ?Patient also have hip pain  ?Worsening surgical intervention at some point for the hip   Gabapentin if needed  Follow-up again in 6 to 8 weeks  ?Prednisone given PRN  ?  ? ?Nonallopathic problems ? ?Decision today to treat with OMT was based on Physical Exam ? ?After verbal consent patient was treated with HVLA, ME, FPR techniques in  thoracic, lumbar, and sacral  areas ? ?Patient tolerated the procedure well with improvement in symptoms ? ?Patient given exercises, stretches and lifestyle modifications ? ?See medications in patient instructions if given ? ?Patient will follow up in 4-8 weeks ? ?  ? ?The above documentation has been reviewed and is accurate and complete [name], DO ? ? ? ?  ? ? Note: This dictation was prepared with Dragon dictation along with smaller phrase technology. Any transcriptional errors that result from this process are unintentional.    ?  ?  ? ?

## 2021-10-30 ENCOUNTER — Ambulatory Visit (INDEPENDENT_AMBULATORY_CARE_PROVIDER_SITE_OTHER): Payer: BC Managed Care – PPO | Admitting: Family Medicine

## 2021-10-30 VITALS — BP 130/80 | HR 87 | Ht 72.0 in | Wt 268.0 lb

## 2021-10-30 DIAGNOSIS — M5416 Radiculopathy, lumbar region: Secondary | ICD-10-CM

## 2021-10-30 DIAGNOSIS — M9904 Segmental and somatic dysfunction of sacral region: Secondary | ICD-10-CM | POA: Diagnosis not present

## 2021-10-30 DIAGNOSIS — M9902 Segmental and somatic dysfunction of thoracic region: Secondary | ICD-10-CM | POA: Diagnosis not present

## 2021-10-30 DIAGNOSIS — M9903 Segmental and somatic dysfunction of lumbar region: Secondary | ICD-10-CM

## 2021-10-30 MED ORDER — PREDNISONE 20 MG PO TABS
20.0000 mg | ORAL_TABLET | Freq: Two times a day (BID) | ORAL | 0 refills | Status: DC
Start: 2021-10-30 — End: 2021-11-06

## 2021-10-30 NOTE — Assessment & Plan Note (Signed)
Chronic with exacerbation  ?Patient also have hip pain  ?Worsening surgical intervention at some point for the hip   Gabapentin if needed  Follow-up again in 6 to 8 weeks  ?Prednisone given PRN  ?

## 2021-10-30 NOTE — Patient Instructions (Addendum)
Good to see you  ?Prednisone '40mg'$  take for five days if needed ?Keep doing exercises ?Follow up in 2 months  ?

## 2021-11-06 ENCOUNTER — Encounter: Payer: Self-pay | Admitting: Gastroenterology

## 2021-11-06 ENCOUNTER — Ambulatory Visit (INDEPENDENT_AMBULATORY_CARE_PROVIDER_SITE_OTHER): Payer: BC Managed Care – PPO | Admitting: Gastroenterology

## 2021-11-06 ENCOUNTER — Other Ambulatory Visit (INDEPENDENT_AMBULATORY_CARE_PROVIDER_SITE_OTHER): Payer: BC Managed Care – PPO

## 2021-11-06 VITALS — BP 130/72 | HR 76 | Ht 72.0 in | Wt 267.0 lb

## 2021-11-06 DIAGNOSIS — K635 Polyp of colon: Secondary | ICD-10-CM | POA: Diagnosis not present

## 2021-11-06 DIAGNOSIS — D126 Benign neoplasm of colon, unspecified: Secondary | ICD-10-CM

## 2021-11-06 DIAGNOSIS — R933 Abnormal findings on diagnostic imaging of other parts of digestive tract: Secondary | ICD-10-CM | POA: Diagnosis not present

## 2021-11-06 DIAGNOSIS — Z86010 Personal history of colonic polyps: Secondary | ICD-10-CM | POA: Diagnosis not present

## 2021-11-06 LAB — BASIC METABOLIC PANEL
BUN: 11 mg/dL (ref 6–23)
CO2: 30 mEq/L (ref 19–32)
Calcium: 9.2 mg/dL (ref 8.4–10.5)
Chloride: 101 mEq/L (ref 96–112)
Creatinine, Ser: 1 mg/dL (ref 0.40–1.50)
GFR: 87.87 mL/min (ref >60.00)
Glucose, Bld: 126 mg/dL — ABNORMAL HIGH (ref 70–99)
Potassium: 3.8 mEq/L (ref 3.5–5.1)
Sodium: 139 mEq/L (ref 135–145)

## 2021-11-06 LAB — PROTIME-INR
INR: 1.1 ratio — ABNORMAL HIGH (ref 0.8–1.0)
Prothrombin Time: 11.8 s (ref 9.6–13.1)

## 2021-11-06 LAB — CBC
HCT: 39.2 % (ref 39.0–52.0)
Hemoglobin: 13.5 g/dL (ref 13.0–17.0)
MCHC: 34.4 g/dL (ref 30.0–36.0)
MCV: 86.2 fl (ref 78.0–100.0)
Platelets: 204 10*3/uL (ref 150.0–400.0)
RBC: 4.54 Mil/uL (ref 4.22–5.81)
RDW: 14 % (ref 11.5–15.5)
WBC: 6.7 10*3/uL (ref 4.0–10.5)

## 2021-11-06 MED ORDER — NA SULFATE-K SULFATE-MG SULF 17.5-3.13-1.6 GM/177ML PO SOLN: 1.0000 | ORAL | 0 refills | Status: DC

## 2021-11-06 NOTE — Progress Notes (Signed)
? ?GASTROENTEROLOGY OUTPATIENT CLINIC VISIT  ? ?Primary Care Provider ?[name], DO ?[address] ?[place] [address] ?[phone] ? ?Referring Provider ?Dr. Loletha Carrow ? ?Patient Profile: ?Jesse [name] is a 51 y.o. male with a pmh significant for hypertension, hyperlipidemia, diabetes, OSA, gout, arthritis, diverticulosis, colon polyps (large cecal/ICV TA left in situ).  The patient presents to the [facility] for an evaluation and management of problem(s) noted below: ? ?Problem List ?1. Adenomatous polyp of ICV/cecum   ?2. Hx of adenomatous colonic polyps   ?3. Abnormal colonoscopy   ? ? ?History of Present Illness ?This patient underwent a screening colonoscopy in March 2023.  At that time he was found to have a large polyp within the ICV and cecal area that was biopsied and returned as a tubular adenoma.  It is for this reason that the patient is referred for consideration of advanced endoscopic resection versus surgical needs of resection.  Today, the patient is feeling and doing well.  He has no other significant GI complaints. ? ?GI Review of Systems ?Positive as above ?Negative for pyrosis, dysphagia, odynophagia, nausea, vomiting, pain, alteration of bowel habits, melena, hematochezia ? ?Review of Systems ?General: Denies fevers/chills/weight loss unintentionally ?Cardiovascular: Denies chest pain ?Pulmonary: Denies shortness of breath ?Gastroenterological: See HPI ?Genitourinary: Denies darkened urine ?Hematological: Denies easy bruising/bleeding ?Dermatological: Denies jaundice ?Psychological: Mood is stable ? ? ?Medications ?Current Outpatient Medications  ?Medication Sig Dispense Refill  ? acyclovir (ZOVIRAX) 400 MG tablet TAKE 1 TABLET TWICE A DAY 180 tablet 3  ? allopurinol (ZYLOPRIM) 300 MG tablet TAKE 1 TABLET DAILY 90 tablet 3  ? ALPRAZolam (XANAX) 0.5 MG tablet TAKE 1 TABLET BY MOUTH AT BEDTIME AS NEEDED FOR ANXIETY 30 tablet 4  ? atorvastatin  (LIPITOR) 40 MG tablet TAKE 1 TABLET DAILY 90 tablet 3  ? buPROPion (WELLBUTRIN XL) 300 MG 24 hr tablet TAKE 1 TABLET DAILY 90 tablet 3  ? colchicine 0.6 MG tablet Take 2 tabs and another in 1 hour during a flare. Take daily after that until it resolves. 30 tablet 2  ? fenofibrate micronized (LOFIBRA) 200 MG capsule TAKE 1 CAPSULE DAILY BEFORE BREAKFAST 90 capsule 3  ? gabapentin (NEURONTIN) 100 MG capsule Take 2 capsules (200 mg total) by mouth at bedtime. 180 capsule 3  ? lisinopril-hydrochlorothiazide (ZESTORETIC) 20-12.5 MG tablet TAKE 1 TABLET DAILY 90 tablet 3  ? meloxicam (MOBIC) 15 MG tablet TAKE 1 TABLET DAILY 90 tablet 3  ? Na Sulfate-K Sulfate-Mg Sulf (SUPREP BOWEL PREP KIT) 17.5-3.13-1.6 GM/177ML SOLN Take 1 kit by mouth as directed. For colonoscopy prep 354 mL 0  ? OZEMPIC, 1 MG/DOSE, 4 MG/3ML SOPN INJECT 1 MG UNDER THE SKIN ONCE A WEEK 9 mL 3  ? tiZANidine (ZANAFLEX) 4 MG capsule TAKE 1 CAPSULE BY MOUTH AT NIGHT 30 capsule 0  ? Vitamin D, Ergocalciferol, (DRISDOL) 1.25 MG (50000 UNIT) CAPS capsule TAKE 1 CAPSULE EVERY 7 DAYS 12 capsule 3  ? ?No current facility-administered medications for this visit.  ? ? ?Allergies ?No Known Allergies ? ?Histories ?Past Medical History:  ?Diagnosis Date  ? Arthritis   ? Diabetes mellitus without complication ([place])   ? Gout   ? Hyperlipidemia   ? Hypertension   ? Sleep apnea   ? WEARS C PAP  ? ?Past Surgical History:  ?Procedure Laterality Date  ? BICEPS TENDON REPAIR Left   ? HERNIA REPAIR  2019  ? KNEE SURGERY Left 2018  ? ?Social History  ? ?  Socioeconomic History  ? Marital status: Single  ?  Spouse name: Not on file  ? Number of children: Not on file  ? Years of education: Not on file  ? Highest education level: Not on file  ?Occupational History  ? Not on file  ?Tobacco Use  ? Smoking status: Never  ? Smokeless tobacco: Never  ?Vaping Use  ? Vaping Use: Never used  ?Substance and Sexual Activity  ? Alcohol use: Never  ? Drug use: Never  ? Sexual activity: Not on  file  ?Other Topics Concern  ? Not on file  ?Social History Narrative  ? Not on file  ? ?Social Determinants of Health  ? ?Financial Resource Strain: Not on file  ?Food Insecurity: Not on file  ?Transportation Needs: Not on file  ?Physical Activity: Not on file  ?Stress: Not on file  ?Social Connections: Not on file  ?Intimate Partner Violence: Not on file  ? ?Family History  ?Problem Relation Age of Onset  ? Heart disease Father   ? Heart disease Paternal Grandmother   ? Heart disease Paternal Grandfather   ? Colon cancer Neg Hx   ? Colon polyps Neg Hx   ? Esophageal cancer Neg Hx   ? Rectal cancer Neg Hx   ? Stomach cancer Neg Hx   ? Inflammatory bowel disease Neg Hx   ? Liver disease Neg Hx   ? Pancreatic cancer Neg Hx   ? ?I have reviewed his medical, social, and family history in detail and updated the electronic medical record as necessary.  ? ? ?PHYSICAL EXAMINATION  ?BP 130/72   Pulse 76   Ht 6' (1.829 m)   Wt 267 lb (121.1 kg)   SpO2 94%   BMI 36.21 kg/m?  ?Wt Readings from Last 3 Encounters:  ?11/06/21 267 lb (121.1 kg)  ?10/30/21 268 lb (121.6 kg)  ?09/25/21 250 lb (113.4 kg)  ?GEN: NAD, appears stated age, doesn't appear chronically ill ?PSYCH: Cooperative, without pressured speech ?EYE: Conjunctivae pink, sclerae anicteric ?ENT: MMM ?CV: Nontachycardic ?RESP: No audible wheezing ?GI: NABS, soft, protuberant abdomen, rounded, NT, without rebound ?MSK/EXT: No lower extremity edema ?SKIN: No jaundice ?NEURO:  Alert & Oriented x 3, no focal deficits ? ? ?REVIEW OF DATA  ?I reviewed the following data at the time of this encounter: ? ?GI Procedures and Studies  ?March 2023 colonoscopy- The examined portion of the ileum was normal. ?- One 25 mm polyp at the ileocecal valve. Biopsied. ?- Diverticulosis in the left colon. ?- The examination was otherwise normal on direct and retroflexion views. ? ?Pathology ?Diagnosis ?Surgical [P], ileocecal valve, polyp (1) (biopsy of polyp) ?- TUBULAR ADENOMA(S) WITHOUT  HIGH GRADE DYSPLASIA. ? ?Laboratory Studies  ?Reviewed those in epic ? ?Imaging Studies  ?No relevant studies to review ? ? ?ASSESSMENT  ?Mr. Jesse [name] is a 51 y.o. male with a pmh significant for hypertension, hyperlipidemia, diabetes, OSA, gout, arthritis, diverticulosis, colon polyps (large cecal/ICV TA left in situ).  The patient is seen today for evaluation and management of: ? ?1. Adenomatous polyp of ICV/cecum   ?2. Hx of adenomatous colonic polyps   ?3. Abnormal colonoscopy   ? ?The patient is clinically and hemodynamically stable.  Based upon the description and endoscopic pictures I do feel that it is reasonable to pursue an Advanced Polypectomy attempt of the polyp/lesion.  With this being said, the location of the polyp, its involvement of the ICV and possibly the terminal ileum with extension into the cecum  is going to make this a much more difficult lesion to remove completely and not have chance of recurrence.  En bloc resection is not possible with this unfortunately.  We discussed some of the techniques of advanced polypectomy which include Endoscopic Mucosal Resection, OVESCO Full-Thickness Resection, Endorotor Morcellation, and Tissue Ablation via Fulguration.  We also reviewed images of typical techniques as noted above.  The risks and benefits of endoscopic evaluation were discussed with the patient; these include but are not limited to the risk of perforation, infection, bleeding, missed lesions, lack of diagnosis, severe illness requiring hospitalization, as well as anesthesia and sedation related illnesses.  During attempts at advanced resection, the risks of bleeding and perforation/leak are increased as opposed to diagnostic and screening procedures, and that was discussed with the patient as well.   In addition, I explained that with the possible need for piecemeal resection, subsequent short-interval endoscopic evaluation for follow up and potential retreatment of the lesion/area may be  necessary.  I did offer, a referral to surgery in order for patient to have opportunity to discuss surgical management/intervention prior to finalizing decision for attempt at endoscopic removal, however, the

## 2021-11-06 NOTE — Patient Instructions (Signed)
You have been scheduled for a colonoscopy. Please follow written instructions given to you at your visit today.  ?Please pick up your prep supplies at the pharmacy within the next 1-3 days. ?If you use inhalers (even only as needed), please bring them with you on the day of your procedure. ? ?We have sent the following medications to your pharmacy for you to pick up at your convenience: ?[place]  ? ?Your provider has requested that you go to the basement level for lab work before leaving today. Press "B" on the elevator. The lab is located at the first door on the left as you exit the elevator. ? ?Thank you for choosing me and [facility]. ? ?Dr. Rush Landmark ? ?

## 2021-11-07 DIAGNOSIS — R933 Abnormal findings on diagnostic imaging of other parts of digestive tract: Secondary | ICD-10-CM | POA: Insufficient documentation

## 2021-11-07 DIAGNOSIS — Z86010 Personal history of colonic polyps: Secondary | ICD-10-CM | POA: Insufficient documentation

## 2021-11-07 DIAGNOSIS — D126 Benign neoplasm of colon, unspecified: Secondary | ICD-10-CM | POA: Insufficient documentation

## 2021-11-26 ENCOUNTER — Encounter: Payer: Self-pay | Admitting: Family Medicine

## 2021-12-05 NOTE — Progress Notes (Signed)
[name] Phone: [phone] Subjective:   [name], am serving as a scribe for Dr. Hulan Saas.  This visit occurred during the SARS-CoV-2 public health emergency.  Safety protocols were in place, including screening questions prior to the visit, additional usage of staff PPE, and extensive cleaning of exam room while observing appropriate contact time as indicated for disinfecting solutions.    I'm seeing this patient by the request  of:  [name], DO  CC: Right hip pain and back pain  [id number]  [name] is a 51 y.o. male coming in with complaint of back and neck pain. OMT on 10/30/2021. Patient states that his R hip pain has increased over past month. Pain is now constant. Wakes up in middle of night. Using meloxicam and IBU for pain relief. Quad and lower back are sore due to change in gait.   Medications patient has been prescribed: Mobic  Taking:         Reviewed prior external information including notes and imaging from previsou exam, outside providers and external EMR if available.   As well as notes that were available from care everywhere and other healthcare systems.  Past medical history, social, surgical and family history all reviewed in electronic medical record.  No pertanent information unless stated regarding to the chief complaint.   Past Medical History:  Diagnosis Date   Arthritis    Diabetes mellitus without complication (HCC)    Gout    Hyperlipidemia    Hypertension    Sleep apnea    WEARS C PAP    No Known Allergies   Review of Systems:  No headache, visual changes, nausea, vomiting, diarrhea, constipation, dizziness, abdominal pain, skin rash, fevers, chills, night sweats, weight loss, swollen lymph nodes, body aches, joint swelling, chest pain, shortness of breath, mood changes. POSITIVE muscle aches  Objective  Blood pressure 122/76,  pulse 73, height 6' (1.829 m), weight 272 lb (123.4 kg), SpO2 99 %.   General: No apparent distress alert and oriented x3 mood and affect normal, dressed appropriately.  HEENT: Pupils equal, extraocular movements intact  Respiratory: Patient's speak in full sentences and does not appear short of breath  Cardiovascular: No lower extremity edema, non tender, no erythema  Antalgic gait noted.  Patient does have tenderness to palpation over the right hip diffusely.  Patient has 5 degrees of internal rotation.  Negative straight leg test.  Osteopathic findings  C2 flexed rotated and side bent right C5 flexed rotated and side bent left T3 extended rotated and side bent right inhaled rib T9 extended rotated and side bent left L2 flexed rotated and side bent right Sacrum right on right       Assessment and Plan:  Arthritis of right hip Severe arthritis of the hip.  With x-rays taken today does show significant progression to near bone-on-bone osteoarthritic changes.  At this point will refer him to an orthopedic surgeon to discuss potential surgical intervention.  Discussed with patient about possible injections and other things but do not think return.  Follow-up again for this problem as needed total time 31 minutes  Lumbar radiculopathy Some increase in tightness noted.  Discussed with patient icing regimen and home exercises.  Discussed core strengthening.  Do feel that it is worsening secondary to patient's arthritis of the hip.  Follow-up again 6 to 8 weeks for more manipulation.   Nonallopathic problems  Decision today to  treat with OMT was based on Physical Exam  After verbal consent patient was treated with HVLA, ME, FPR techniques in cervical, rib, thoracic, lumbar, and sacral  areas  Patient tolerated the procedure well with improvement in symptoms  Patient given exercises, stretches and lifestyle modifications  See medications in patient instructions if given  Patient  will follow up in 4-8 weeks      The above documentation has been reviewed and is accurate and complete [name], DO       Note: This dictation was prepared with Dragon dictation along with smaller phrase technology. Any transcriptional errors that result from this process are unintentional.

## 2021-12-06 ENCOUNTER — Ambulatory Visit (INDEPENDENT_AMBULATORY_CARE_PROVIDER_SITE_OTHER): Payer: BC Managed Care – PPO | Admitting: Family Medicine

## 2021-12-06 ENCOUNTER — Ambulatory Visit (INDEPENDENT_AMBULATORY_CARE_PROVIDER_SITE_OTHER): Payer: BC Managed Care – PPO

## 2021-12-06 ENCOUNTER — Encounter: Payer: Self-pay | Admitting: Family Medicine

## 2021-12-06 VITALS — BP 122/76 | HR 73 | Ht 72.0 in | Wt 272.0 lb

## 2021-12-06 DIAGNOSIS — M9901 Segmental and somatic dysfunction of cervical region: Secondary | ICD-10-CM | POA: Diagnosis not present

## 2021-12-06 DIAGNOSIS — M9902 Segmental and somatic dysfunction of thoracic region: Secondary | ICD-10-CM

## 2021-12-06 DIAGNOSIS — M9908 Segmental and somatic dysfunction of rib cage: Secondary | ICD-10-CM | POA: Diagnosis not present

## 2021-12-06 DIAGNOSIS — M9904 Segmental and somatic dysfunction of sacral region: Secondary | ICD-10-CM

## 2021-12-06 DIAGNOSIS — M25551 Pain in right hip: Secondary | ICD-10-CM | POA: Diagnosis not present

## 2021-12-06 DIAGNOSIS — M1611 Unilateral primary osteoarthritis, right hip: Secondary | ICD-10-CM

## 2021-12-06 DIAGNOSIS — M5416 Radiculopathy, lumbar region: Secondary | ICD-10-CM | POA: Diagnosis not present

## 2021-12-06 DIAGNOSIS — M9903 Segmental and somatic dysfunction of lumbar region: Secondary | ICD-10-CM

## 2021-12-06 MED ORDER — TRAMADOL HCL 50 MG PO TABS: 50.0000 mg | ORAL_TABLET | Freq: Three times a day (TID) | ORAL | 0 refills | Status: DC | PRN

## 2021-12-06 NOTE — Assessment & Plan Note (Signed)
Some increase in tightness noted.  Discussed with patient icing regimen and home exercises.  Discussed core strengthening.  Do feel that it is worsening secondary to patient's arthritis of the hip.  Follow-up again 6 to 8 weeks for more manipulation.

## 2021-12-06 NOTE — Patient Instructions (Addendum)
Referral to Dr. Latanya Maudlin Tylenol '500mg'$  3x a day IBU or Meloxicam  Tramdol for severe pain-start with half See me in 6-8 weeks

## 2021-12-06 NOTE — Assessment & Plan Note (Signed)
Severe arthritis of the hip.  With x-rays taken today does show significant progression to near bone-on-bone osteoarthritic changes.  At this point will refer him to an orthopedic surgeon to discuss potential surgical intervention.  Discussed with patient about possible injections and other things but do not think return.  Follow-up again for this problem as needed total time 31 minutes

## 2021-12-10 DIAGNOSIS — M1611 Unilateral primary osteoarthritis, right hip: Secondary | ICD-10-CM | POA: Diagnosis not present

## 2021-12-13 ENCOUNTER — Encounter: Payer: Self-pay | Admitting: Family Medicine

## 2021-12-20 ENCOUNTER — Ambulatory Visit (INDEPENDENT_AMBULATORY_CARE_PROVIDER_SITE_OTHER): Payer: BC Managed Care – PPO | Admitting: Family Medicine

## 2021-12-20 ENCOUNTER — Encounter: Payer: Self-pay | Admitting: Family Medicine

## 2021-12-20 VITALS — BP 120/78 | HR 71 | Temp 98.0°F | Ht 72.0 in | Wt 272.1 lb

## 2021-12-20 DIAGNOSIS — Z01818 Encounter for other preprocedural examination: Secondary | ICD-10-CM

## 2021-12-20 NOTE — Progress Notes (Signed)
Subjective:   Chief Complaint  Patient presents with   [place]  is here for a Pre-operative physical at the request of Dr. Rhona Raider.   He  is having R hip replacement surgery which has not been scheduled yet for R hip OA  Personal or family hx of adverse outcome to anesthesia? No  Chipped, cracked, missing, or loose teeth? No  Decreased ROM of neck? No  Able to walk up 2 flights of stairs without becoming significantly short of breath or having chest pain? Yes   Revised Goldman Criteria: High Risk Surgery (intraperitoneal, intrathoracic, aortic): No  Ischemic heart disease (Prior MI, +excercise stress test, angina, nitrate use, Qwave): No  History of heart failure: No  History of cerebrovascular disease: No  History of diabetes: Yes  Insulin therapy for DM: No  Preoperative Cr >2.0: No   Patient Active Problem List   Diagnosis Date Noted   Adenomatous polyp of colon 11/07/2021   Hx of adenomatous colonic polyps 11/07/2021   Abnormal colonoscopy 11/07/2021   Essential hypertension 03/23/2021   Chronic gout without tophus 03/23/2021   Diabetes mellitus type 2 in obese ([place]) 09/20/2020   Nonallopathic lesion of sacral region 07/30/2018   Nonallopathic lesion of lumbosacral region 07/30/2018   Nonallopathic lesion of thoracic region 07/30/2018   Arthritis of right hip 04/27/2018   Lumbar radiculopathy 01/05/2018   Anterior leg pain, right 12/08/2017   Past Medical History:  Diagnosis Date   Arthritis    Diabetes mellitus without complication (HCC)    Gout    Hyperlipidemia    Hypertension    Sleep apnea    WEARS C PAP    Past Surgical History:  Procedure Laterality Date   BICEPS TENDON REPAIR Left    HERNIA REPAIR  2019   KNEE SURGERY Left 2018    Current Outpatient Medications  Medication Sig Dispense Refill   acyclovir (ZOVIRAX) 400 MG tablet TAKE 1 TABLET TWICE A DAY 180 tablet 3   allopurinol (ZYLOPRIM) 300 MG tablet TAKE 1 TABLET DAILY 90  tablet 3   ALPRAZolam (XANAX) 0.5 MG tablet TAKE 1 TABLET BY MOUTH AT BEDTIME AS NEEDED FOR ANXIETY 30 tablet 4   atorvastatin (LIPITOR) 40 MG tablet TAKE 1 TABLET DAILY 90 tablet 3   buPROPion (WELLBUTRIN XL) 300 MG 24 hr tablet TAKE 1 TABLET DAILY 90 tablet 3   colchicine 0.6 MG tablet Take 2 tabs and another in 1 hour during a flare. Take daily after that until it resolves. 30 tablet 2   fenofibrate micronized (LOFIBRA) 200 MG capsule TAKE 1 CAPSULE DAILY BEFORE BREAKFAST 90 capsule 3   gabapentin (NEURONTIN) 100 MG capsule Take 2 capsules (200 mg total) by mouth at bedtime. 180 capsule 3   lisinopril-hydrochlorothiazide (ZESTORETIC) 20-12.5 MG tablet TAKE 1 TABLET DAILY 90 tablet 3   meloxicam (MOBIC) 15 MG tablet TAKE 1 TABLET DAILY 90 tablet 3   Na Sulfate-K Sulfate-Mg Sulf (SUPREP BOWEL PREP KIT) 17.5-3.13-1.6 GM/177ML SOLN Take 1 kit by mouth as directed. For colonoscopy prep 354 mL 0   OZEMPIC, 1 MG/DOSE, 4 MG/3ML SOPN INJECT 1 MG UNDER THE SKIN ONCE A WEEK 9 mL 3   tiZANidine (ZANAFLEX) 4 MG capsule TAKE 1 CAPSULE BY MOUTH AT NIGHT 30 capsule 0   Vitamin D, Ergocalciferol, (DRISDOL) 1.25 MG (50000 UNIT) CAPS capsule TAKE 1 CAPSULE EVERY 7 DAYS 12 capsule 3   No current facility-administered medications for this visit.    No Known  Allergies  Family History  Problem Relation Age of Onset   Heart disease Father    Heart disease Paternal Grandmother    Heart disease Paternal Grandfather    Colon cancer Neg Hx    Colon polyps Neg Hx    Esophageal cancer Neg Hx    Rectal cancer Neg Hx    Stomach cancer Neg Hx    Inflammatory bowel disease Neg Hx    Liver disease Neg Hx    Pancreatic cancer Neg Hx      Review of Systems:  Constitutional:  no fevers Eye:  no recent significant change in vision Ear:  no hearing loss Nose/Mouth/Throat:  No dental complaints Neck/Thyroid:  no lumps or masses Pulmonary:  No shortness of breath Cardiovascular:  no chest pain Gastrointestinal:   no abdominal pain GU:  negative for dysuria Musculoskeletal/Extremities:  no pain Skin/Integumentary ROS:  no abnormal skin lesions reported Neurologic:  no HA   Objective:   Vitals:   12/20/21 1014  BP: 120/78  Pulse: 71  Temp: 98 F (36.7 C)  TempSrc: Oral  SpO2: 97%  Weight: 272 lb 2 oz (123.4 kg)  Height: 6' (1.829 m)   Body mass index is 36.91 kg/m.  General:  well developed, well nourished, in no apparent distress Skin:  warm, no pallor or diaphoresis Head:  normocephalic, atraumatic Eyes:  pupils equal and round, sclera anicteric without injection Ears:  canals without lesions, TMs shiny without retraction, no obvious effusion, no erythema Throat/Pharynx:  lips and gingiva without lesion; tongue and uvula midline; non-inflamed pharynx; no exudates or postnasal drainage Neck: neck supple without adenopathy, thyromegaly, or masses, no bruits, no jugular venous distention Lungs:  clear to auscultation, breath sounds equal bilaterally, no respiratory distress Cardio:  regular rate and rhythm without murmurs Abdomen:  abdomen soft, nontender; bowel sounds normal; no masses, hepatomegaly or splenomegaly Musculoskeletal:  symmetrical muscle groups noted without atrophy or deformity Extremities:  no clubbing, cyanosis, or edema, no deformities, no skin discoloration Neuro:  gait normal; deep tendon reflexes normal and symmetric and alert and oriented to person, place, and time Psych: Age appropriate judgment and insight; normal mood   Assessment:   Pre-op exam   Plan:   Orders as above. Labs to be ordered by primary. Will hold today. DM well controlled. Home readings stable. Will hold on checking today in light of that.  The patient is deemed low cardiac risk for the proposed procedure (1% according to the revised [place] Criteria).   The patient voiced understanding and agreement to the plan.  [place], DO 12/20/21  10:32 AM

## 2021-12-20 NOTE — Patient Instructions (Signed)
We will fax this today.   Let us know if you need anything.

## 2021-12-21 ENCOUNTER — Encounter (HOSPITAL_COMMUNITY): Payer: Self-pay | Admitting: Gastroenterology

## 2021-12-24 NOTE — Progress Notes (Signed)
Attempted to obtain medical history via telephone, unable to reach at this time. HIPAA compliant voicemail message left requesting return call to pre surgical testing department. 

## 2021-12-27 ENCOUNTER — Other Ambulatory Visit: Payer: Self-pay | Admitting: Family Medicine

## 2021-12-31 ENCOUNTER — Encounter (HOSPITAL_COMMUNITY): Payer: Self-pay | Admitting: Gastroenterology

## 2022-01-01 ENCOUNTER — Encounter (HOSPITAL_COMMUNITY): Payer: Self-pay | Admitting: Gastroenterology

## 2022-01-01 ENCOUNTER — Ambulatory Visit (HOSPITAL_COMMUNITY)
Admission: RE | Admit: 2022-01-01 | Discharge: 2022-01-01 | Disposition: A | Discharge status: Home / Self Care | Payer: BC Managed Care – PPO | Source: Ambulatory Visit | Attending: Gastroenterology | Admitting: Gastroenterology

## 2022-01-01 ENCOUNTER — Ambulatory Visit (HOSPITAL_COMMUNITY): Payer: BC Managed Care – PPO | Admitting: Anesthesiology

## 2022-01-01 ENCOUNTER — Encounter (HOSPITAL_COMMUNITY)
Admission: RE | Disposition: A | Discharge status: Home / Self Care | Payer: Self-pay | Source: Ambulatory Visit | Attending: Gastroenterology

## 2022-01-01 ENCOUNTER — Ambulatory Visit: Payer: BC Managed Care – PPO | Admitting: Family Medicine

## 2022-01-01 DIAGNOSIS — I10 Essential (primary) hypertension: Secondary | ICD-10-CM | POA: Insufficient documentation

## 2022-01-01 DIAGNOSIS — E119 Type 2 diabetes mellitus without complications: Secondary | ICD-10-CM | POA: Diagnosis not present

## 2022-01-01 DIAGNOSIS — Z86010 Personal history of colonic polyps: Secondary | ICD-10-CM | POA: Diagnosis not present

## 2022-01-01 DIAGNOSIS — D120 Benign neoplasm of cecum: Secondary | ICD-10-CM

## 2022-01-01 DIAGNOSIS — G4730 Sleep apnea, unspecified: Secondary | ICD-10-CM | POA: Insufficient documentation

## 2022-01-01 DIAGNOSIS — K644 Residual hemorrhoidal skin tags: Secondary | ICD-10-CM | POA: Insufficient documentation

## 2022-01-01 DIAGNOSIS — K641 Second degree hemorrhoids: Secondary | ICD-10-CM | POA: Diagnosis not present

## 2022-01-01 DIAGNOSIS — D126 Benign neoplasm of colon, unspecified: Secondary | ICD-10-CM

## 2022-01-01 HISTORY — PX: ENDOSCOPIC MUCOSAL RESECTION: SHX6839

## 2022-01-01 HISTORY — PX: POLYPECTOMY: SHX5525

## 2022-01-01 HISTORY — PX: HOT HEMOSTASIS: SHX5433

## 2022-01-01 HISTORY — PX: SUBMUCOSAL LIFTING INJECTION: SHX6855

## 2022-01-01 HISTORY — PX: COLONOSCOPY WITH PROPOFOL: SHX5780

## 2022-01-01 LAB — GLUCOSE, CAPILLARY: Glucose-Capillary: 122 mg/dL — ABNORMAL HIGH (ref 70–99)

## 2022-01-01 SURGERY — COLONOSCOPY WITH PROPOFOL
Anesthesia: Monitor Anesthesia Care

## 2022-01-01 MED ORDER — PROPOFOL 500 MG/50ML IV EMUL
INTRAVENOUS | Status: DC | PRN
  Administered 2022-01-01: 100 ug/kg/min via INTRAVENOUS
  Administered 2022-01-01: 20 mg via INTRAVENOUS
  Administered 2022-01-01: 30 mg via INTRAVENOUS

## 2022-01-01 MED ORDER — ONDANSETRON HCL 4 MG/2ML IJ SOLN
INTRAMUSCULAR | Status: DC | PRN
  Administered 2022-01-01: 4 mg via INTRAVENOUS

## 2022-01-01 MED ORDER — MIDAZOLAM HCL 2 MG/2ML IJ SOLN
INTRAMUSCULAR | Status: AC
  Filled 2022-01-01: qty 2

## 2022-01-01 MED ORDER — PHENYLEPHRINE HCL (PRESSORS) 10 MG/ML IV SOLN
INTRAVENOUS | Status: DC | PRN
  Administered 2022-01-01: 80 ug via INTRAVENOUS

## 2022-01-01 MED ORDER — MIDAZOLAM HCL 5 MG/5ML IJ SOLN
INTRAMUSCULAR | Status: DC | PRN
  Administered 2022-01-01: 2 mg via INTRAVENOUS

## 2022-01-01 MED ORDER — LIDOCAINE HCL (CARDIAC) PF 100 MG/5ML IV SOSY
PREFILLED_SYRINGE | INTRAVENOUS | Status: DC | PRN
  Administered 2022-01-01: 40 mg via INTRAVENOUS
  Administered 2022-01-01: 60 mg via INTRAVENOUS

## 2022-01-01 MED ORDER — PROPOFOL 1000 MG/100ML IV EMUL
INTRAVENOUS | Status: AC
  Filled 2022-01-01: qty 100

## 2022-01-01 MED ORDER — LACTATED RINGERS IV SOLN: INTRAVENOUS | Status: DC

## 2022-01-01 MED ORDER — SODIUM CHLORIDE 0.9 % IV SOLN: INTRAVENOUS | Status: DC

## 2022-01-01 SURGICAL SUPPLY — 22 items

## 2022-01-01 NOTE — H&P (Signed)
GASTROENTEROLOGY PROCEDURE H&P NOTE   Primary Care Physician: [name], DO  HPI: Jesse [name] is a 51 y.o. male who presents for Colonoscoy to attempt endoscopic TA removal on the ICV/Cecum.  Past Medical History:  Diagnosis Date   Arthritis    Diabetes mellitus without complication (HCC)    Gout    Hyperlipidemia    Hypertension    Sleep apnea    WEARS C PAP   Past Surgical History:  Procedure Laterality Date   BICEPS TENDON REPAIR Left    HERNIA REPAIR  2019   KNEE SURGERY Left 2018   Current Facility-Administered Medications  Medication Dose Route Frequency Provider Last Rate Last Admin   0.9 %  sodium chloride infusion   Intravenous Continuous Mansouraty, Telford Nab., MD       lactated ringers infusion   Intravenous Continuous Mansouraty, Telford Nab., MD 10 mL/hr at 01/01/22 0910 New Bag at 01/01/22 0910    Current Facility-Administered Medications:    0.9 %  sodium chloride infusion, , Intravenous, Continuous, Mansouraty, Telford Nab., MD   lactated ringers infusion, , Intravenous, Continuous, Mansouraty, Telford Nab., MD, Last Rate: 10 mL/hr at 01/01/22 0910, New Bag at 01/01/22 0910 No Known Allergies Family History  Problem Relation Age of Onset   Heart disease Father    Heart disease Paternal Grandmother    Heart disease Paternal Grandfather    Colon cancer Neg Hx    Colon polyps Neg Hx    Esophageal cancer Neg Hx    Rectal cancer Neg Hx    Stomach cancer Neg Hx    Inflammatory bowel disease Neg Hx    Liver disease Neg Hx    Pancreatic cancer Neg Hx    Social History   Socioeconomic History   Marital status: Single    Spouse name: Not on file   Number of children: Not on file   Years of education: Not on file   Highest education level: Not on file  Occupational History   Not on file  Tobacco Use   Smoking status: Never   Smokeless tobacco: Never  Vaping Use   Vaping Use: Never used  Substance and Sexual Activity   Alcohol use:  Never   Drug use: Never   Sexual activity: Not on file  Other Topics Concern   Not on file  Social History Narrative   Not on file   Social Determinants of Health   Financial Resource Strain: Not on file  Food Insecurity: Not on file  Transportation Needs: Not on file  Physical Activity: Not on file  Stress: Not on file  Social Connections: Not on file  Intimate Partner Violence: Not on file    Physical Exam: Today's Vitals   01/01/22 0900  BP: (!) 148/79  Pulse: 80  Resp: 19  Temp: 98.4 F (36.9 C)  TempSrc: Oral  SpO2: 97%  Weight: 113.4 kg  Height: 6' (1.829 m)  PainSc: 0-No pain   Body mass index is 33.91 kg/m. GEN: NAD EYE: Sclerae anicteric ENT: MMM CV: Non-tachycardic GI: Soft, NT/ND NEURO:  Alert & Oriented x 3  Lab Results: No results for input(s): "WBC", "HGB", "HCT", "PLT" in the last 72 hours. BMET No results for input(s): "NA", "K", "CL", "CO2", "GLUCOSE", "BUN", "CREATININE", "CALCIUM" in the last 72 hours. LFT No results for input(s): "PROT", "ALBUMIN", "AST", "ALT", "ALKPHOS", "BILITOT", "BILIDIR", "IBILI" in the last 72 hours. PT/INR No results for input(s): "LABPROT", "INR" in the last 72 hours.  Impression / Plan: This is a 51 y.o.male who presents for Colonoscoy to attempt endoscopic TA removal on the ICV/Cecum.  The risks and benefits of endoscopic evaluation/treatment were discussed with the patient and/or family; these include but are not limited to the risk of perforation, infection, bleeding, missed lesions, lack of diagnosis, severe illness requiring hospitalization, as well as anesthesia and sedation related illnesses.  The patient's history has been reviewed, patient examined, no change in status, and deemed stable for procedure.  The patient and/or family is agreeable to proceed.    Justice Britain, MD [facility] Office # [phone]

## 2022-01-01 NOTE — Discharge Instructions (Signed)

## 2022-01-01 NOTE — Transfer of Care (Signed)
Immediate Anesthesia Transfer of Care Note  Patient: Jesse [name]  Procedure(s) Performed: COLONOSCOPY WITH PROPOFOL ENDOSCOPIC MUCOSAL RESECTION POLYPECTOMY SUBMUCOSAL LIFTING INJECTION HOT HEMOSTASIS (ARGON PLASMA COAGULATION/BICAP)  Patient Location: PACU  Anesthesia Type:MAC  Level of Consciousness: awake and alert   Airway & Oxygen Therapy: Patient Spontanous Breathing and aerosol face mask  Post-op Assessment: Report given to RN and Post -op Vital signs reviewed and stable  Post vital signs: Reviewed and stable  Last Vitals:  Vitals Value Taken Time  BP    Temp    Pulse    Resp    SpO2      Last Pain:  Vitals:   01/01/22 0900  TempSrc: Oral  PainSc: 0-No pain         Complications: No notable events documented.

## 2022-01-01 NOTE — Anesthesia Postprocedure Evaluation (Signed)
Anesthesia Post Note  Patient: Jesse [name]  Procedure(s) Performed: COLONOSCOPY WITH PROPOFOL ENDOSCOPIC MUCOSAL RESECTION POLYPECTOMY SUBMUCOSAL LIFTING INJECTION HOT HEMOSTASIS (ARGON PLASMA COAGULATION/BICAP)     Patient location during evaluation: PACU Anesthesia Type: MAC Level of consciousness: awake and alert Pain management: pain level controlled Vital Signs Assessment: post-procedure vital signs reviewed and stable Respiratory status: spontaneous breathing, nonlabored ventilation, respiratory function stable and patient connected to nasal cannula oxygen Cardiovascular status: stable and blood pressure returned to baseline Postop Assessment: no apparent nausea or vomiting Anesthetic complications: no   No notable events documented.  Last Vitals:  Vitals:   01/01/22 1200 01/01/22 1210  BP: 114/65 102/76  Pulse: 81 85  Resp: 19 13  Temp: 36.7 C 36.8 C  SpO2:      Last Pain:  Vitals:   01/01/22 1210  TempSrc: Temporal  PainSc: 0-No pain                 Chere Babson S

## 2022-01-01 NOTE — Anesthesia Preprocedure Evaluation (Signed)
Anesthesia Evaluation  Patient identified by MRN, date of birth, ID band Patient awake    Reviewed: Allergy & Precautions, NPO status , Patient's Chart, lab work & pertinent test results  Airway Mallampati: II  TM Distance: >3 FB Neck ROM: Full    Dental no notable dental hx.    Pulmonary sleep apnea and Continuous Positive Airway Pressure Ventilation ,    Pulmonary exam normal breath sounds clear to auscultation       Cardiovascular hypertension, Pt. on medications Normal cardiovascular exam Rhythm:Regular Rate:Normal     Neuro/Psych negative neurological ROS  negative psych ROS   GI/Hepatic negative GI ROS, Neg liver ROS,   Endo/Other  diabetes, Type 2  Renal/GU negative Renal ROS  negative genitourinary   Musculoskeletal negative musculoskeletal ROS (+)   Abdominal   Peds negative pediatric ROS (+)  Hematology negative hematology ROS (+)   Anesthesia Other Findings   Reproductive/Obstetrics negative OB ROS                             Anesthesia Physical Anesthesia Plan  ASA: 2  Anesthesia Plan: MAC   Post-op Pain Management: Minimal or no pain anticipated   Induction: Intravenous  PONV Risk Score and Plan: 1 and Treatment may vary due to age or medical condition and Propofol infusion  Airway Management Planned: Simple Face Mask  Additional Equipment:   Intra-op Plan:   Post-operative Plan:   Informed Consent: I have reviewed the patients History and Physical, chart, labs and discussed the procedure including the risks, benefits and alternatives for the proposed anesthesia with the patient or authorized representative who has indicated his/her understanding and acceptance.     Dental advisory given  Plan Discussed with: CRNA and Surgeon  Anesthesia Plan Comments:         Anesthesia Quick Evaluation

## 2022-01-01 NOTE — Op Note (Addendum)
[facility] Patient Name: Jesse [name] Procedure Date: 01/01/2022 MRN: [id number] Attending MD: Justice Britain , MD Date of Birth: 1971-01-09 CSN: [id number] Age: 51 Admit Type: Outpatient Procedure:                Colonoscopy Indications:              Excision of colonic polyp Providers:                Justice Britain, MD, Burtis Junes, RN, Despina Pole, Technician Referring MD:             Estill Cotta. Loletha Carrow, MD Medicines:                Monitored Anesthesia Care Complications:            No immediate complications. Estimated Blood Loss:     Estimated blood loss was minimal. Procedure:                Pre-Anesthesia Assessment:                           - Prior to the procedure, a History and Physical                            was performed, and patient medications and                            allergies were reviewed. The patient's tolerance of                            previous anesthesia was also reviewed. The risks                            and benefits of the procedure and the sedation                            options and risks were discussed with the patient.                            All questions were answered, and informed consent                            was obtained. Prior Anticoagulants: The patient has                            taken no previous anticoagulant or antiplatelet                            agents except for NSAID medication. ASA Grade                            Assessment: II - A patient with mild systemic  disease. After reviewing the risks and benefits,                            the patient was deemed in satisfactory condition to                            undergo the procedure.                           After obtaining informed consent, the colonoscope                            was passed under direct vision. Throughout the                            procedure, the  patient's blood pressure, pulse, and                            oxygen saturations were monitored continuously. The                            CF-HQ190L ([id number]) Olympus colonoscope was                            introduced through the anus and advanced to the 10                            cm into the ileum. The colonoscopy was technically                            difficult and complex due to location of polyp                            within TI/ICV/cecum. Successful completion of the                            procedure was aided by performing the maneuvers                            documented (below) in this report. The patient                            tolerated the procedure. The quality of the bowel                            preparation was adequate. The terminal ileum,                            ileocecal valve, appendiceal orifice, and rectum                            were photographed. Scope In: 11:02:51 AM Scope Out: 11:54:56 AM Scope Withdrawal Time: 0 hours 49 minutes 21 seconds  Total Procedure Duration: 0 hours 52 minutes  5 seconds  Findings:      The digital rectal exam findings include hemorrhoids. Pertinent       negatives include no palpable rectal lesions.      The majority of the examined terminal ileum appeared normal.      A 30 mm polyp was found at the ileocecal valve. The polyp was granular       lateral spreading. It was noted on the ascending colon side of the ICV,       around the distal aspect of the ICV lip and did enter into the TI       itself. On multiple occasions, as a result of peristalsis, it would       enter into the TI and then propulse out into the cecum. Preparations       were made for attempt at mucosal resection. NBI imaging and White-light       endoscopy was done to demarcate the borders of the lesion. Everlift was       injected to raise the lesion initially within the TI, to try and push it       outwards into the colon. This was  partially successful. The area on the       ascending colon side of the ICV did not have as adequate a lift however.       Piecemeal mucosal resection using a snare was performed. Resection and       retrieval were felt to be complete. Fulguration to ablate the lesion by       argon plasma to the margin and the base of the resection was successful.       Due to the majority of the polyp having been on the fatty portion of the       ICV and within the TI, complete closure was not possible. I could not       grasp the fatty portion of the ICV surface for complete closure even       with our largest clip. Thus only one hemostatic clip was successfully       placed (MR conditional) after the mucosal resection. There was no       bleeding at the end of the procedure.      A 5 mm polyp was found in the cecum. The polyp was sessile. The polyp       was removed with a cold snare. Resection and retrieval were complete.      Normal mucosa was found in the entire colon otherwise.      Non-bleeding non-thrombosed external and internal hemorrhoids were found       during retroflexion, during perianal exam and during digital exam. The       hemorrhoids were Grade II (internal hemorrhoids that prolapse but reduce       spontaneously). Impression:               - Hemorrhoids found on digital rectal exam.                           - The majority of the terminal ileum was normal in                            appearance.                           -  One 30 mm polyp at the ileocecal valve with                            extension slightly into the terminal ileum and also                            onto the ascending colon side of the ICV was found.                            This was removed with mucosal resection as noted                            above. Resected and retrieved. Treated with argon                            plasma coagulation (APC) to resection base/margin.                            Clip (MR  conditional) was placed (complete closure                            was not possible as noted above).                           - One 5 mm polyp in the cecum, removed with a cold                            snare. Resected and retrieved.                           - Normal mucosa in the entire examined colon                            otherwise.                           - Non-bleeding non-thrombosed external and internal                            hemorrhoids. Moderate Sedation:      Not Applicable - Patient had care per Anesthesia. Recommendation:           - The patient will be observed post-procedure,                            until all discharge criteria are met.                           - Discharge patient to home.                           - Patient has a contact number available for  emergencies. The signs and symptoms of potential                            delayed complications were discussed with the                            patient. Return to normal activities tomorrow.                            Written discharge instructions were provided to the                            patient.                           - Low fiber diet for 1 week and then transition                            back to high-fiber diet.                           - Minimize NSAID use as able.                           - Continue present medications.                           - Await pathology results.                           - Repeat colonoscopy in 6-9 months for surveillance                            as long as no evidence of malignancy is found. If                            there is recurrence in future, surgical options                            will likely need to be considered. Should have                            Endorotor/Avulsion ready.                           - The findings and recommendations were discussed                            with the patient.                            - The findings and recommendations were discussed                            with the patient's family. Procedure Code(s):        ---  Professional ---                           [phone], Colonoscopy, flexible; with endoscopic                            mucosal resection                           45385, 59, Colonoscopy, flexible; with removal of                            tumor(s), polyp(s), or other lesion(s) by snare                            technique Diagnosis Code(s):        --- Professional ---                           K64.1, Second degree hemorrhoids                           K63.5, Polyp of colon CPT copyright 2019 American Medical Association. All rights reserved. The codes documented in this report are preliminary and upon coder review may  be revised to meet current compliance requirements. Justice Britain, MD 01/01/2022 12:20:51 PM Number of Addenda: 0

## 2022-01-02 ENCOUNTER — Other Ambulatory Visit: Payer: Self-pay | Admitting: Family Medicine

## 2022-01-02 ENCOUNTER — Encounter: Payer: Self-pay | Admitting: Gastroenterology

## 2022-01-02 LAB — SURGICAL PATHOLOGY

## 2022-01-02 NOTE — Telephone Encounter (Signed)
Last OV--12/20/2021 Last RF---03/20/21--#30 with 4 refills.

## 2022-01-06 ENCOUNTER — Encounter (HOSPITAL_COMMUNITY): Payer: Self-pay | Admitting: Gastroenterology

## 2022-01-14 DIAGNOSIS — M25651 Stiffness of right hip, not elsewhere classified: Secondary | ICD-10-CM | POA: Diagnosis not present

## 2022-01-14 DIAGNOSIS — R262 Difficulty in walking, not elsewhere classified: Secondary | ICD-10-CM | POA: Diagnosis not present

## 2022-01-14 DIAGNOSIS — M1611 Unilateral primary osteoarthritis, right hip: Secondary | ICD-10-CM | POA: Diagnosis not present

## 2022-01-18 NOTE — Progress Notes (Signed)
Jesse [name] [place] Jesse [name] Phone: [phone] Subjective:   Jesse [name], am serving as a scribe for Dr. Hulan [name].  I'm seeing this patient by the request  of:  Jesse Pal, DO  CC: Right hip pain follow-up, back pain  [id number]  Jesse [name] is a 51 y.o. male coming in with complaint of back and neck pain. OMT on 12/06/2021. Also seen for hip pain. Patient states that he is having leg numbness in both legs specifically in quads. Notes weakness due to legs giving out. Having R THR next week. Using Celebrex for pain management instead of tramadol.  Patient has failed all other conservative therapy at this moment including formal physical therapy when it comes to his back and hips.  Medications patient has been prescribed: Tramadol  Taking:         Reviewed prior external information including notes and imaging from previsou exam, outside providers and external EMR if available.   As well as notes that were available from care everywhere and other healthcare systems.  Past medical history, social, surgical and family history all reviewed in electronic medical record.  [name] pertanent information unless stated regarding to the chief complaint.   Past Medical History:  Diagnosis Date   Arthritis    Diabetes mellitus without complication (HCC)    Gout    Hyperlipidemia    Hypertension    Sleep apnea    WEARS C PAP    [name] Known Allergies   Review of Systems:  [name] headache, visual changes, nausea, vomiting, diarrhea, constipation, dizziness, abdominal pain, skin rash, fevers, chills, night sweats, weight loss, swollen lymph nodes, body aches, joint swelling, chest pain, shortness of breath, mood changes. POSITIVE muscle aches  Objective  Blood pressure 122/78, pulse 89, height 6' (1.829 m), weight 269 lb (122 kg), SpO2 98 %.   General: [name] apparent distress alert and oriented x3 mood and affect normal,  dressed appropriately.  HEENT: Pupils equal, extraocular movements intact  Respiratory: Patient's speak in full sentences and does not appear short of breath  Cardiovascular: [name] lower extremity edema, non tender, [name] erythema  Gait antalgic favoring the right hip MSK:  Back low back pain noted on exam.  Loss of lordosis.  Patient does have tightness with straight leg test with some radicular symptoms that appears at 30 degrees of forward flexion.  Patient does have weakness with hip flexion bilaterally right greater than left.  Deep tendon reflexes do appear to be intact.  Osteopathic findings   T9 extended rotated and side bent left L2 flexed rotated and side bent right Sacrum right on right       Assessment and Plan:  Lumbar radiculopathy Patient seems to be having worsening lumbar radiculopathy.  Seems to be more in the L2 and L3 nerve correspondence.  Patient's x-rays do show a spondylolisthesis at this point as well.  Concerning for this to be more of a spinal stenosis.  Patient will get MRI to further evaluate especially with patient describing more of the weakness that he is having in the quadriceps.  Depending on the findings patient could be a candidate for possible injections but may need to hold until after the replacement of the hip.  Patient will follow-up with me again 8 weeks after surgery.  Arthritis of right hip Having replacement next week.    Nonallopathic problems  Decision today to treat with OMT was based on Physical Exam  After  verbal consent patient was treated with ME, FPR techniques in  thoracic, lumbar, and sacral  areas avoided HVLA secondary to patient being in pain on the back.  Patient tolerated the procedure well with improvement in symptoms  Patient given exercises, stretches and lifestyle modifications  See medications in patient instructions if given  Patient will follow up in 8 weeks     The above documentation has been reviewed and is  accurate and complete Jesse Pulley, DO         Note: This dictation was prepared with Dragon dictation along with smaller phrase technology. Any transcriptional errors that result from this process are unintentional.

## 2022-01-19 HISTORY — PX: HIP SURGERY: SHX245

## 2022-01-25 ENCOUNTER — Other Ambulatory Visit: Payer: Self-pay | Admitting: Family Medicine

## 2022-01-25 DIAGNOSIS — M1611 Unilateral primary osteoarthritis, right hip: Secondary | ICD-10-CM | POA: Diagnosis not present

## 2022-01-25 DIAGNOSIS — M25551 Pain in right hip: Secondary | ICD-10-CM | POA: Diagnosis not present

## 2022-01-29 ENCOUNTER — Ambulatory Visit (INDEPENDENT_AMBULATORY_CARE_PROVIDER_SITE_OTHER): Payer: BC Managed Care – PPO | Admitting: Family Medicine

## 2022-01-29 VITALS — BP 122/78 | HR 89 | Ht 72.0 in | Wt 269.0 lb

## 2022-01-29 DIAGNOSIS — M9904 Segmental and somatic dysfunction of sacral region: Secondary | ICD-10-CM

## 2022-01-29 DIAGNOSIS — M1611 Unilateral primary osteoarthritis, right hip: Secondary | ICD-10-CM | POA: Diagnosis not present

## 2022-01-29 DIAGNOSIS — M9902 Segmental and somatic dysfunction of thoracic region: Secondary | ICD-10-CM | POA: Diagnosis not present

## 2022-01-29 DIAGNOSIS — M5416 Radiculopathy, lumbar region: Secondary | ICD-10-CM | POA: Diagnosis not present

## 2022-01-29 DIAGNOSIS — M9903 Segmental and somatic dysfunction of lumbar region: Secondary | ICD-10-CM | POA: Diagnosis not present

## 2022-01-29 NOTE — Patient Instructions (Addendum)
MRI [phone] Depending on finding consider epidural Going to do great on hip surgery Can usual start manipulation again 7 weeks after surgery

## 2022-01-29 NOTE — Assessment & Plan Note (Signed)
Patient seems to be having worsening lumbar radiculopathy.  Seems to be more in the L2 and L3 nerve correspondence.  Patient's x-rays do show a spondylolisthesis at this point as well.  Concerning for this to be more of a spinal stenosis.  Patient will get MRI to further evaluate especially with patient describing more of the weakness that he is having in the quadriceps.  Depending on the findings patient could be a candidate for possible injections but may need to hold until after the replacement of the hip.  Patient will follow-up with me again 8 weeks after surgery.

## 2022-01-29 NOTE — Assessment & Plan Note (Signed)
Having replacement next week.

## 2022-02-06 ENCOUNTER — Ambulatory Visit
Admission: RE | Admit: 2022-02-06 | Discharge: 2022-02-06 | Disposition: A | Discharge status: Home / Self Care | Payer: BC Managed Care – PPO | Source: Ambulatory Visit | Attending: Family Medicine | Admitting: Family Medicine

## 2022-02-06 ENCOUNTER — Encounter: Payer: Self-pay | Admitting: Family Medicine

## 2022-02-06 DIAGNOSIS — M5416 Radiculopathy, lumbar region: Secondary | ICD-10-CM

## 2022-02-06 DIAGNOSIS — M4316 Spondylolisthesis, lumbar region: Secondary | ICD-10-CM | POA: Diagnosis not present

## 2022-02-06 DIAGNOSIS — M545 Low back pain, unspecified: Secondary | ICD-10-CM | POA: Diagnosis not present

## 2022-02-06 DIAGNOSIS — R200 Anesthesia of skin: Secondary | ICD-10-CM | POA: Diagnosis not present

## 2022-02-07 DIAGNOSIS — M1611 Unilateral primary osteoarthritis, right hip: Secondary | ICD-10-CM | POA: Diagnosis not present

## 2022-03-06 ENCOUNTER — Other Ambulatory Visit: Payer: Self-pay | Admitting: Family Medicine

## 2022-03-06 DIAGNOSIS — I10 Essential (primary) hypertension: Secondary | ICD-10-CM

## 2022-04-01 NOTE — Progress Notes (Unsigned)
  Jesse [name] [address] [place] Phone: [phone] Subjective:   IVilma Meckel, am serving as a scribe for Dr. Hulan Saas.  I'm seeing this patient by the request  of:  [name], DO  CC: Back and neck pain  [id number]  Jesse [name] is a 51 y.o. male coming in with complaint of back and neck pain. OMT on 01/29/2022.   Patient did have a hip replacements since we have seen him.  Patient did have an MRI of the lumbar spine in July of this year showing that at L3-L4 had a large disc bulge causing a left L4 nerve root impingement.  Patient states wants to have conversation about direction for management of his back pain. No new issues.   Medications patient has been prescribed:   Taking:         Reviewed prior external information including notes and imaging from previsou exam, outside providers and external EMR if available.   As well as notes that were available from care everywhere and other healthcare systems.  Past medical history, social, surgical and family history all reviewed in electronic medical record.  No pertanent information unless stated regarding to the chief complaint.   Past Medical History:  Diagnosis Date   Arthritis    Diabetes mellitus without complication (HCC)    Gout    Hyperlipidemia    Hypertension    Sleep apnea    WEARS C PAP    No Known Allergies   Review of Systems:  No headache, visual changes, nausea, vomiting, diarrhea, constipation, dizziness, abdominal pain, skin rash, fevers, chills, night sweats, weight loss, swollen lymph nodes, body aches, joint swelling, chest pain, shortness of breath, mood changes. POSITIVE muscle aches  Objective  There were no vitals taken for this visit.   General: No apparent distress alert and oriented x3 mood and affect normal, dressed appropriately.  HEENT: Pupils equal, extraocular movements intact  Respiratory: Patient's speak in  full sentences and does not appear short of breath  Cardiovascular: No lower extremity edema, non tender, no erythema  Gait MSK:  Back   Osteopathic findings  C2 flexed rotated and side bent right C6 flexed rotated and side bent left T3 extended rotated and side bent right inhaled rib T9 extended rotated and side bent left L2 flexed rotated and side bent right Sacrum right on right       Assessment and Plan:  No problem-specific Assessment & Plan notes found for this encounter.    Nonallopathic problems  Decision today to treat with OMT was based on Physical Exam  After verbal consent patient was treated with HVLA, ME, FPR techniques in cervical, rib, thoracic, lumbar, and sacral  areas  Patient tolerated the procedure well with improvement in symptoms  Patient given exercises, stretches and lifestyle modifications  See medications in patient instructions if given  Patient will follow up in 4-8 weeks    The above documentation has been reviewed and is accurate and complete Jesse Pulley, DO          Note: This dictation was prepared with Dragon dictation along with smaller phrase technology. Any transcriptional errors that result from this process are unintentional.

## 2022-04-02 ENCOUNTER — Ambulatory Visit (INDEPENDENT_AMBULATORY_CARE_PROVIDER_SITE_OTHER): Payer: BC Managed Care – PPO | Admitting: Family Medicine

## 2022-04-02 VITALS — BP 120/82 | HR 78 | Ht 72.0 in | Wt 270.0 lb

## 2022-04-02 DIAGNOSIS — M9901 Segmental and somatic dysfunction of cervical region: Secondary | ICD-10-CM

## 2022-04-02 DIAGNOSIS — M5416 Radiculopathy, lumbar region: Secondary | ICD-10-CM | POA: Diagnosis not present

## 2022-04-02 DIAGNOSIS — M9904 Segmental and somatic dysfunction of sacral region: Secondary | ICD-10-CM | POA: Diagnosis not present

## 2022-04-02 DIAGNOSIS — M9908 Segmental and somatic dysfunction of rib cage: Secondary | ICD-10-CM | POA: Diagnosis not present

## 2022-04-02 DIAGNOSIS — M9903 Segmental and somatic dysfunction of lumbar region: Secondary | ICD-10-CM

## 2022-04-02 DIAGNOSIS — M9902 Segmental and somatic dysfunction of thoracic region: Secondary | ICD-10-CM

## 2022-04-02 NOTE — Patient Instructions (Signed)
[place] [phone] Call Today  Glad hip is working out really well See you 5-6 weeks after epidural

## 2022-04-03 ENCOUNTER — Encounter: Payer: Self-pay | Admitting: Family Medicine

## 2022-04-03 NOTE — Assessment & Plan Note (Signed)
Still radicular symptoms, discussed MRI results, did respond to OMT but epidural would help with radicular symptoms we think. Chronic with exacerbation  RTC in 6 weeks

## 2022-04-04 ENCOUNTER — Encounter: Payer: Self-pay | Admitting: Family Medicine

## 2022-04-04 ENCOUNTER — Ambulatory Visit (INDEPENDENT_AMBULATORY_CARE_PROVIDER_SITE_OTHER): Payer: BC Managed Care – PPO | Admitting: Family Medicine

## 2022-04-04 VITALS — BP 131/65 | HR 71 | Temp 98.1°F | Resp 16 | Ht 71.5 in | Wt 270.0 lb

## 2022-04-04 DIAGNOSIS — E669 Obesity, unspecified: Secondary | ICD-10-CM | POA: Diagnosis not present

## 2022-04-04 DIAGNOSIS — Z23 Encounter for immunization: Secondary | ICD-10-CM

## 2022-04-04 DIAGNOSIS — Z0000 Encounter for general adult medical examination without abnormal findings: Secondary | ICD-10-CM | POA: Diagnosis not present

## 2022-04-04 DIAGNOSIS — Z6837 Body mass index (BMI) 37.0-37.9, adult: Secondary | ICD-10-CM | POA: Diagnosis not present

## 2022-04-04 DIAGNOSIS — E1169 Type 2 diabetes mellitus with other specified complication: Secondary | ICD-10-CM | POA: Diagnosis not present

## 2022-04-04 DIAGNOSIS — Z125 Encounter for screening for malignant neoplasm of prostate: Secondary | ICD-10-CM

## 2022-04-04 DIAGNOSIS — Z7984 Long term (current) use of oral hypoglycemic drugs: Secondary | ICD-10-CM | POA: Diagnosis not present

## 2022-04-04 DIAGNOSIS — B353 Tinea pedis: Secondary | ICD-10-CM

## 2022-04-04 LAB — LIPID PANEL
Cholesterol: 138 mg/dL (ref 0–200)
HDL: 28.9 mg/dL — ABNORMAL LOW (ref >39.00)
Total CHOL/HDL Ratio: 5
Triglycerides: 523 mg/dL — ABNORMAL HIGH (ref 0.0–149.0)

## 2022-04-04 LAB — CBC
HCT: 38.4 % — ABNORMAL LOW (ref 39.0–52.0)
Hemoglobin: 13 g/dL (ref 13.0–17.0)
MCHC: 33.9 g/dL (ref 30.0–36.0)
MCV: 88.3 fl (ref 78.0–100.0)
Platelets: 213 10*3/uL (ref 150.0–400.0)
RBC: 4.35 Mil/uL (ref 4.22–5.81)
RDW: 13.9 % (ref 11.5–15.5)
WBC: 5 10*3/uL (ref 4.0–10.5)

## 2022-04-04 LAB — COMPREHENSIVE METABOLIC PANEL
ALT: 14 U/L (ref 0–53)
AST: 15 U/L (ref 0–37)
Albumin: 4.2 g/dL (ref 3.5–5.2)
Alkaline Phosphatase: 69 U/L (ref 39–117)
BUN: 10 mg/dL (ref 6–23)
CO2: 30 mEq/L (ref 19–32)
Calcium: 9.1 mg/dL (ref 8.4–10.5)
Chloride: 102 mEq/L (ref 96–112)
Creatinine, Ser: 0.92 mg/dL (ref 0.40–1.50)
GFR: 96.84 mL/min (ref >60.00)
Glucose, Bld: 115 mg/dL — ABNORMAL HIGH (ref 70–99)
Potassium: 4.2 mEq/L (ref 3.5–5.1)
Sodium: 140 mEq/L (ref 135–145)
Total Bilirubin: 0.6 mg/dL (ref 0.2–1.2)
Total Protein: 7.1 g/dL (ref 6.0–8.3)

## 2022-04-04 LAB — PSA: PSA: 0.24 ng/mL (ref 0.10–4.00)

## 2022-04-04 LAB — MICROALBUMIN / CREATININE URINE RATIO
Creatinine,U: 129.5 mg/dL
Microalb Creat Ratio: 2.2 mg/g (ref 0.0–30.0)
Microalb, Ur: 2.9 mg/dL — ABNORMAL HIGH (ref 0.0–1.9)

## 2022-04-04 LAB — HEMOGLOBIN A1C: Hgb A1c MFr Bld: 5.9 % (ref 4.6–6.5)

## 2022-04-04 LAB — LDL CHOLESTEROL, DIRECT: Direct LDL: 57 mg/dL

## 2022-04-04 MED ORDER — KETOCONAZOLE 2 % EX CREA: 1.0000 | TOPICAL_CREAM | Freq: Every day | CUTANEOUS | 0 refills | Status: AC

## 2022-04-04 NOTE — Progress Notes (Signed)
Chief Complaint  Patient presents with   Annual Exam    Well Male Jesse [name] is here for a complete physical.   His last physical was >1 year ago.  Current diet: in general, a "healthy" diet.  Current exercise: limited 2/2 recent surgery and chronic disc bulge Weight trend: stable Fatigue out of ordinary? Yes. Seat belt? Yes.   Advanced directive? No  Health maintenance Shingrix- No Colonoscopy- Yes Tetanus- Yes HIV- Yes Hep C- Yes   Past Medical History:  Diagnosis Date   Arthritis    Diabetes mellitus without complication (HCC)    Gout    Hyperlipidemia    Hypertension    Sleep apnea    WEARS C PAP      Past Surgical History:  Procedure Laterality Date   BICEPS TENDON REPAIR Left    COLONOSCOPY WITH PROPOFOL N/A 01/01/2022   Procedure: COLONOSCOPY WITH PROPOFOL;  Surgeon: Irving Copas., MD;  Location: [facility];  Service: Gastroenterology;  Laterality: N/A;   ENDOSCOPIC MUCOSAL RESECTION N/A 01/01/2022   Procedure: ENDOSCOPIC MUCOSAL RESECTION;  Surgeon: Rush Landmark Telford Nab., MD;  Location: [facility];  Service: Gastroenterology;  Laterality: N/A;  ICV polyp   HERNIA REPAIR  2019   HOT HEMOSTASIS N/A 01/01/2022   Procedure: HOT HEMOSTASIS (ARGON PLASMA COAGULATION/BICAP);  Surgeon: Irving Copas., MD;  Location: [facility];  Service: Gastroenterology;  Laterality: N/A;  ICV EMR   KNEE SURGERY Left 2018   POLYPECTOMY  01/01/2022   Procedure: POLYPECTOMY;  Surgeon: Mansouraty, Telford Nab., MD;  Location: [facility];  Service: Gastroenterology;;  cecal   SUBMUCOSAL LIFTING INJECTION  01/01/2022   Procedure: SUBMUCOSAL LIFTING INJECTION;  Surgeon: Irving Copas., MD;  Location: [facility];  Service: Gastroenterology;;  ICV EMR    Medications  Current Outpatient Medications on File Prior to Visit  Medication Sig Dispense Refill   acyclovir (ZOVIRAX) 400 MG tablet TAKE 1 TABLET TWICE A DAY 180 tablet 3   allopurinol (ZYLOPRIM)  300 MG tablet TAKE 1 TABLET DAILY 90 tablet 3   ALPRAZolam (XANAX) 0.5 MG tablet TAKE 1 TABLET BY MOUTH AT BEDTIME AS NEEDED FOR ANXIETY 30 tablet 5   atorvastatin (LIPITOR) 40 MG tablet TAKE 1 TABLET DAILY 90 tablet 3   buPROPion (WELLBUTRIN XL) 300 MG 24 hr tablet TAKE 1 TABLET DAILY 90 tablet 3   colchicine 0.6 MG tablet Take 2 tabs and another in 1 hour during a flare. Take daily after that until it resolves. 30 tablet 2   fenofibrate micronized (LOFIBRA) 200 MG capsule TAKE 1 CAPSULE DAILY BEFORE BREAKFAST 90 capsule 3   gabapentin (NEURONTIN) 100 MG capsule Take 2 capsules (200 mg total) by mouth at bedtime. (Patient taking differently: Take 200 mg by mouth at bedtime as needed (Nerve pain).) 180 capsule 3   lisinopril-hydrochlorothiazide (ZESTORETIC) 20-12.5 MG tablet TAKE 1 TABLET DAILY 90 tablet 3   meloxicam (MOBIC) 15 MG tablet TAKE 1 TABLET DAILY 90 tablet 3   OZEMPIC, 1 MG/DOSE, 4 MG/3ML SOPN INJECT 1 MG UNDER THE SKIN ONCE A WEEK 9 mL 3   tiZANidine (ZANAFLEX) 4 MG capsule TAKE 1 CAPSULE BY MOUTH AT NIGHT 30 capsule 0   Vitamin D, Ergocalciferol, (DRISDOL) 1.25 MG (50000 UNIT) CAPS capsule TAKE 1 CAPSULE EVERY 7 DAYS 12 capsule 3    Allergies No Known Allergies  Family History Family History  Problem Relation Age of Onset   Heart disease Father    Heart disease Paternal Grandmother    Heart  disease Paternal Grandfather    Colon cancer Neg Hx    Colon polyps Neg Hx    Esophageal cancer Neg Hx    Rectal cancer Neg Hx    Stomach cancer Neg Hx    Inflammatory bowel disease Neg Hx    Liver disease Neg Hx    Pancreatic cancer Neg Hx     Review of Systems: Constitutional:  no fevers Eye:  no recent significant change in vision Ear/Nose/Mouth/Throat:  Ears:  no hearing loss Nose/Mouth/Throat:  no complaints of nasal congestion, no sore throat Cardiovascular:  no chest pain Respiratory:  no shortness of breath Gastrointestinal:  no change in bowel habits GU:  Male:  negative for dysuria, frequency Musculoskeletal/Extremities:  no new joint pain Integumentary (Skin/Breast):  no abnormal skin lesions reported Neurologic:  no headaches Endocrine: No unexpected weight changes Hematologic/Lymphatic:  no abnormal bleeding  Exam BP 131/65   Pulse 71   Temp 98.1 F (36.7 C) (Oral)   Resp 16   Ht 5' 11.5" (1.816 m)   Wt 270 lb (122.5 kg)   SpO2 98%   BMI 37.13 kg/m  General:  well developed, well nourished, in no apparent distress Skin: macerated tissue b/t 3/4 digit on L foot; otherwise no foot lesions noted; no significant moles, warts, or growths Head:  no masses, lesions, or tenderness Eyes:  pupils equal and round, sclera anicteric without injection Ears:  canals without lesions, TMs shiny without retraction, no obvious effusion, no erythema Nose:  nares patent, septum midline, mucosa normal Throat/Pharynx:  lips and gingiva without lesion; tongue and uvula midline; non-inflamed pharynx; no exudates or postnasal drainage Neck: neck supple without adenopathy, thyromegaly, or masses Cardiac: RRR, no bruits, no LE edema Lungs:  clear to auscultation, breath sounds equal bilaterally, no respiratory distress Abdomen: BS+, soft, non-tender, non-distended, no masses or organomegaly noted Rectal: Deferred Musculoskeletal:  symmetrical muscle groups noted without atrophy or deformity Neuro:  gait normal; deep tendon reflexes normal and symmetric; sensation intact to pinprick b/l feet Psych: well oriented with normal range of affect and appropriate judgment/insight  Assessment and Plan  Well adult exam - Plan: CBC, Comprehensive metabolic panel, Lipid panel  Diabetes mellitus type 2 in obese (HCC) - Plan: Hemoglobin A1c, Microalbumin / creatinine urine ratio  Screening for prostate cancer - Plan: PSA  Tinea pedis of left foot - Plan: ketoconazole (NIZORAL) 2 % cream  Needs flu shot - Plan: Flu Vaccine QUAD 6+ mos PF IM (Fluarix Quad PF)   Well 51  y.o. male. Counseled on diet and exercise. Counseled on risks and benefits of prostate cancer screening with PSA. The patient agrees to undergo testing. Shingrix rec'd. Flu shot today.  Needs to schedule eye exam.  Advanced directive form provided today.  Immunizations, labs, and further orders as above. Follow up in 6 mo or prn. The patient voiced understanding and agreement to the plan.  [place], DO 04/04/22 10:08 AM

## 2022-04-04 NOTE — Patient Instructions (Addendum)
Give [place] 2-3 business days to get the results of your labs back.   Keep the diet clean and stay active.  Please get me a copy of your advanced directive form at your convenience.   Please get your eye exam.   Let us know if you need anything.

## 2022-04-09 ENCOUNTER — Ambulatory Visit
Admission: RE | Admit: 2022-04-09 | Discharge: 2022-04-09 | Disposition: A | Discharge status: Home / Self Care | Payer: BC Managed Care – PPO | Source: Ambulatory Visit | Attending: Family Medicine | Admitting: Family Medicine

## 2022-04-09 DIAGNOSIS — M5416 Radiculopathy, lumbar region: Secondary | ICD-10-CM

## 2022-04-09 DIAGNOSIS — M47816 Spondylosis without myelopathy or radiculopathy, lumbar region: Secondary | ICD-10-CM | POA: Diagnosis not present

## 2022-04-09 MED ORDER — IOPAMIDOL (ISOVUE-M 200) INJECTION 41%
1.0000 mL | Freq: Once | INTRAMUSCULAR | Status: AC
  Administered 2022-04-09: 1 mL via EPIDURAL

## 2022-04-09 MED ORDER — METHYLPREDNISOLONE ACETATE 40 MG/ML INJ SUSP (RADIOLOG
80.0000 mg | Freq: Once | INTRAMUSCULAR | Status: AC
  Administered 2022-04-09: 80 mg via EPIDURAL

## 2022-04-09 NOTE — Discharge Instructions (Signed)

## 2022-04-10 ENCOUNTER — Encounter: Payer: Self-pay | Admitting: Gastroenterology

## 2022-04-10 DIAGNOSIS — G4733 Obstructive sleep apnea (adult) (pediatric): Secondary | ICD-10-CM | POA: Diagnosis not present

## 2022-04-11 DIAGNOSIS — G4733 Obstructive sleep apnea (adult) (pediatric): Secondary | ICD-10-CM | POA: Diagnosis not present

## 2022-04-16 DIAGNOSIS — G4733 Obstructive sleep apnea (adult) (pediatric): Secondary | ICD-10-CM | POA: Diagnosis not present

## 2022-04-25 ENCOUNTER — Other Ambulatory Visit: Payer: Self-pay

## 2022-04-25 DIAGNOSIS — R933 Abnormal findings on diagnostic imaging of other parts of digestive tract: Secondary | ICD-10-CM

## 2022-04-25 DIAGNOSIS — D126 Benign neoplasm of colon, unspecified: Secondary | ICD-10-CM

## 2022-04-25 DIAGNOSIS — Z86010 Personal history of colonic polyps: Secondary | ICD-10-CM

## 2022-04-25 MED ORDER — PEG 3350-KCL-NA BICARB-NACL 420 G PO SOLR: 4000.0000 mL | Freq: Once | ORAL | 0 refills | Status: AC

## 2022-04-30 DIAGNOSIS — F4323 Adjustment disorder with mixed anxiety and depressed mood: Secondary | ICD-10-CM | POA: Diagnosis not present

## 2022-05-20 DIAGNOSIS — M5416 Radiculopathy, lumbar region: Secondary | ICD-10-CM | POA: Diagnosis not present

## 2022-05-21 DIAGNOSIS — G4733 Obstructive sleep apnea (adult) (pediatric): Secondary | ICD-10-CM | POA: Diagnosis not present

## 2022-06-17 ENCOUNTER — Encounter (HOSPITAL_COMMUNITY): Payer: Self-pay | Admitting: Gastroenterology

## 2022-06-20 DIAGNOSIS — G4733 Obstructive sleep apnea (adult) (pediatric): Secondary | ICD-10-CM | POA: Diagnosis not present

## 2022-06-23 NOTE — Anesthesia Preprocedure Evaluation (Signed)
Anesthesia Evaluation  Patient identified by MRN, date of birth, ID band Patient awake    Reviewed: Allergy & Precautions, NPO status , Patient's Chart, lab work & pertinent test results  History of Anesthesia Complications Negative for: history of anesthetic complications  Airway Mallampati: II  TM Distance: >3 FB Neck ROM: Full    Dental no notable dental hx.    Pulmonary sleep apnea and Continuous Positive Airway Pressure Ventilation    Pulmonary exam normal        Cardiovascular hypertension, Pt. on medications Normal cardiovascular exam     Neuro/Psych   Anxiety     negative neurological ROS     GI/Hepatic Neg liver ROS,,,Colon polyp   Endo/Other  diabetes (on Ozempic (last dose >1 week ago)), Type 2    Renal/GU negative Renal ROS  negative genitourinary   Musculoskeletal  (+) Arthritis ,    Abdominal   Peds  Hematology negative hematology ROS (+)   Anesthesia Other Findings Day of surgery medications reviewed with patient.  Reproductive/Obstetrics negative OB ROS                              Anesthesia Physical Anesthesia Plan  ASA: 2  Anesthesia Plan: MAC   Post-op Pain Management: Minimal or no pain anticipated   Induction:   PONV Risk Score and Plan: Treatment may vary due to age or medical condition, Propofol infusion and TIVA  Airway Management Planned: Natural Airway and Simple Face Mask  Additional Equipment: None  Intra-op Plan:   Post-operative Plan:   Informed Consent: I have reviewed the patients History and Physical, chart, labs and discussed the procedure including the risks, benefits and alternatives for the proposed anesthesia with the patient or authorized representative who has indicated his/her understanding and acceptance.       Plan Discussed with: CRNA  Anesthesia Plan Comments:         Anesthesia Quick Evaluation

## 2022-06-24 ENCOUNTER — Other Ambulatory Visit: Payer: Self-pay

## 2022-06-24 ENCOUNTER — Encounter (HOSPITAL_COMMUNITY)
Admission: RE | Disposition: A | Discharge status: Home / Self Care | Payer: Self-pay | Source: Home / Self Care | Attending: Gastroenterology

## 2022-06-24 ENCOUNTER — Ambulatory Visit (HOSPITAL_COMMUNITY): Payer: BC Managed Care – PPO | Admitting: Anesthesiology

## 2022-06-24 ENCOUNTER — Encounter (HOSPITAL_COMMUNITY): Payer: Self-pay | Admitting: Gastroenterology

## 2022-06-24 ENCOUNTER — Ambulatory Visit (HOSPITAL_COMMUNITY)
Admission: RE | Admit: 2022-06-24 | Discharge: 2022-06-24 | Disposition: A | Discharge status: Home / Self Care | Payer: BC Managed Care – PPO | Attending: Gastroenterology | Admitting: Gastroenterology

## 2022-06-24 DIAGNOSIS — R933 Abnormal findings on diagnostic imaging of other parts of digestive tract: Secondary | ICD-10-CM

## 2022-06-24 DIAGNOSIS — D1339 Benign neoplasm of other parts of small intestine: Secondary | ICD-10-CM

## 2022-06-24 DIAGNOSIS — I10 Essential (primary) hypertension: Secondary | ICD-10-CM | POA: Diagnosis not present

## 2022-06-24 DIAGNOSIS — K6389 Other specified diseases of intestine: Secondary | ICD-10-CM | POA: Diagnosis not present

## 2022-06-24 DIAGNOSIS — D126 Benign neoplasm of colon, unspecified: Secondary | ICD-10-CM

## 2022-06-24 DIAGNOSIS — G4730 Sleep apnea, unspecified: Secondary | ICD-10-CM | POA: Insufficient documentation

## 2022-06-24 DIAGNOSIS — K641 Second degree hemorrhoids: Secondary | ICD-10-CM | POA: Insufficient documentation

## 2022-06-24 DIAGNOSIS — Z7985 Long-term (current) use of injectable non-insulin antidiabetic drugs: Secondary | ICD-10-CM | POA: Insufficient documentation

## 2022-06-24 DIAGNOSIS — Z79899 Other long term (current) drug therapy: Secondary | ICD-10-CM | POA: Insufficient documentation

## 2022-06-24 DIAGNOSIS — Z86010 Personal history of colonic polyps: Secondary | ICD-10-CM

## 2022-06-24 DIAGNOSIS — M1990 Unspecified osteoarthritis, unspecified site: Secondary | ICD-10-CM | POA: Diagnosis not present

## 2022-06-24 DIAGNOSIS — Z09 Encounter for follow-up examination after completed treatment for conditions other than malignant neoplasm: Secondary | ICD-10-CM

## 2022-06-24 DIAGNOSIS — K644 Residual hemorrhoidal skin tags: Secondary | ICD-10-CM | POA: Insufficient documentation

## 2022-06-24 DIAGNOSIS — E119 Type 2 diabetes mellitus without complications: Secondary | ICD-10-CM | POA: Diagnosis not present

## 2022-06-24 DIAGNOSIS — D120 Benign neoplasm of cecum: Secondary | ICD-10-CM | POA: Diagnosis not present

## 2022-06-24 DIAGNOSIS — Z860101 Personal history of adenomatous and serrated colon polyps: Secondary | ICD-10-CM

## 2022-06-24 HISTORY — PX: HEMOSTASIS CLIP PLACEMENT: SHX6857

## 2022-06-24 HISTORY — PX: ENDOSCOPIC MUCOSAL RESECTION: SHX6839

## 2022-06-24 HISTORY — PX: POLYPECTOMY: SHX5525

## 2022-06-24 HISTORY — PX: SUBMUCOSAL LIFTING INJECTION: SHX6855

## 2022-06-24 HISTORY — PX: COLONOSCOPY WITH PROPOFOL: SHX5780

## 2022-06-24 LAB — GLUCOSE, CAPILLARY: Glucose-Capillary: 129 mg/dL — ABNORMAL HIGH (ref 70–99)

## 2022-06-24 SURGERY — COLONOSCOPY WITH PROPOFOL
Anesthesia: Monitor Anesthesia Care

## 2022-06-24 MED ORDER — PROPOFOL 500 MG/50ML IV EMUL
INTRAVENOUS | Status: DC | PRN
  Administered 2022-06-24: 120 ug/kg/min via INTRAVENOUS

## 2022-06-24 MED ORDER — PROPOFOL 10 MG/ML IV BOLUS
INTRAVENOUS | Status: AC
  Filled 2022-06-24: qty 20

## 2022-06-24 MED ORDER — PROPOFOL 500 MG/50ML IV EMUL
INTRAVENOUS | Status: AC
  Filled 2022-06-24: qty 50

## 2022-06-24 MED ORDER — LIDOCAINE HCL (CARDIAC) PF 100 MG/5ML IV SOSY
PREFILLED_SYRINGE | INTRAVENOUS | Status: DC | PRN
  Administered 2022-06-24: 80 mg via INTRAVENOUS

## 2022-06-24 MED ORDER — LACTATED RINGERS IV SOLN
INTRAVENOUS | Status: AC | PRN
  Administered 2022-06-24: 1000 mL via INTRAVENOUS

## 2022-06-24 MED ORDER — PROPOFOL 10 MG/ML IV BOLUS
INTRAVENOUS | Status: DC | PRN
  Administered 2022-06-24: 50 mg via INTRAVENOUS
  Administered 2022-06-24 (×2): 20 mg via INTRAVENOUS
  Administered 2022-06-24: 30 mg via INTRAVENOUS
  Administered 2022-06-24: 20 mg via INTRAVENOUS
  Administered 2022-06-24: 80 mg via INTRAVENOUS
  Administered 2022-06-24: 40 mg via INTRAVENOUS

## 2022-06-24 MED ORDER — SODIUM CHLORIDE 0.9 % IV SOLN: INTRAVENOUS | Status: DC

## 2022-06-24 SURGICAL SUPPLY — 22 items

## 2022-06-24 NOTE — Anesthesia Postprocedure Evaluation (Signed)
Anesthesia Post Note  Patient: Jesse [name]  Procedure(s) Performed: COLONOSCOPY WITH PROPOFOL ENDOSCOPIC MUCOSAL RESECTION POLYPECTOMY SUBMUCOSAL LIFTING INJECTION HEMOSTASIS CLIP PLACEMENT     Patient location during evaluation: PACU Anesthesia Type: MAC Level of consciousness: awake and alert Pain management: pain level controlled Vital Signs Assessment: post-procedure vital signs reviewed and stable Respiratory status: spontaneous breathing, nonlabored ventilation and respiratory function stable Cardiovascular status: blood pressure returned to baseline Postop Assessment: no apparent nausea or vomiting Anesthetic complications: no   No notable events documented.  Last Vitals:  Vitals:   06/24/22 0900 06/24/22 0904  BP: (!) 154/122 (!) 172/78  Pulse: 81 84  Resp: 16 20  Temp:    SpO2: 97% 97%    Last Pain:  Vitals:   06/24/22 0904  TempSrc:   PainSc: 0-No pain                 Marthenia Rolling

## 2022-06-24 NOTE — Transfer of Care (Signed)
Immediate Anesthesia Transfer of Care Note  Patient: Jesse [name]  Procedure(s) Performed: COLONOSCOPY WITH PROPOFOL ENDOSCOPIC MUCOSAL RESECTION POLYPECTOMY SUBMUCOSAL LIFTING INJECTION HEMOSTASIS CLIP PLACEMENT  Patient Location: Endoscopy Unit  Anesthesia Type:MAC  Level of Consciousness: awake, alert , oriented, drowsy, and patient cooperative  Airway & Oxygen Therapy: Patient Spontanous Breathing and Patient connected to face mask oxygen  Post-op Assessment: Report given to RN and Post -op Vital signs reviewed and stable  Post vital signs: Reviewed and stable. Airway removed by CRNA in PACU; patient maintaining patent airway.  Last Vitals:  Vitals Value Taken Time  BP 113/69 0838  Temp    Pulse 91   Resp 15   SpO2 97%     Last Pain:  Vitals:   06/24/22 0648  TempSrc: Tympanic  PainSc: 0-No pain         Complications: No notable events documented.

## 2022-06-24 NOTE — Discharge Instructions (Signed)
YOU HAD AN ENDOSCOPIC PROCEDURE TODAY: Refer to the procedure report and other information in the discharge instructions given to you for any specific questions about what was found during the examination. If this information does not answer your questions, please call [place] office at [phone] to clarify.  ° °YOU SHOULD EXPECT: Some feelings of bloating in the abdomen. Passage of more gas than usual. Walking can help get rid of the air that was put into your GI tract during the procedure and reduce the bloating. If you had a lower endoscopy (such as a colonoscopy or flexible sigmoidoscopy) you may notice spotting of blood in your stool or on the toilet paper. Some abdominal soreness may be present for a day or two, also. ° °DIET: Your first meal following the procedure should be a light meal and then it is ok to progress to your normal diet. A half-sandwich or bowl of soup is an example of a good first meal. Heavy or fried foods are harder to digest and may make you feel nauseous or bloated. Drink plenty of fluids but you should avoid alcoholic beverages for 24 hours. If you had a esophageal dilation, please see attached instructions for diet.   ° °ACTIVITY: Your care partner should take you home directly after the procedure. You should plan to take it easy, moving slowly for the rest of the day. You can resume normal activity the day after the procedure however YOU SHOULD NOT DRIVE, use power tools, machinery or perform tasks that involve climbing or major physical exertion for 24 hours (because of the sedation medicines used during the test).  ° °SYMPTOMS TO REPORT IMMEDIATELY: °A gastroenterologist can be reached at any hour. Please call [phone]  for any of the following symptoms:  °Following lower endoscopy (colonoscopy, flexible sigmoidoscopy) °Excessive amounts of blood in the stool  °Significant tenderness, worsening of abdominal pains  °Swelling of the abdomen that is new, acute  °Fever of 100° or  higher  °Following upper endoscopy (EGD, EUS, ERCP, esophageal dilation) °Vomiting of blood or coffee ground material  °New, significant abdominal pain  °New, significant chest pain or pain under the shoulder blades  °Painful or persistently difficult swallowing  °New shortness of breath  °Black, tarry-looking or red, bloody stools ° °FOLLOW UP:  °If any biopsies were taken you will be contacted by phone or by letter within the next 1-3 weeks. Call [phone]  if you have not heard about the biopsies in 3 weeks.  °Please also call with any specific questions about appointments or follow up tests. ° °

## 2022-06-24 NOTE — H&P (Signed)
GASTROENTEROLOGY PROCEDURE H&P NOTE   Primary Care Physician: [name], DO  HPI: Jesse [name] is a 51 y.o. male who presents for Colonoscopy for surveillance of previous ICV/AC/TI TVA s/p piecemeal EMR in 2022.  Past Medical History:  Diagnosis Date   Arthritis    Diabetes mellitus without complication ([place])    Gout    Hyperlipidemia    Hypertension    Sleep apnea    WEARS C PAP   Past Surgical History:  Procedure Laterality Date   BICEPS TENDON REPAIR Left    COLONOSCOPY WITH PROPOFOL N/A 01/01/2022   Procedure: COLONOSCOPY WITH PROPOFOL;  Surgeon: Rush Landmark Telford Nab., MD;  Location: [facility];  Service: Gastroenterology;  Laterality: N/A;   ENDOSCOPIC MUCOSAL RESECTION N/A 01/01/2022   Procedure: ENDOSCOPIC MUCOSAL RESECTION;  Surgeon: Rush Landmark Telford Nab., MD;  Location: [facility];  Service: Gastroenterology;  Laterality: N/A;  ICV polyp   HERNIA REPAIR  2019   HIP SURGERY Right 01/2022   HOT HEMOSTASIS N/A 01/01/2022   Procedure: HOT HEMOSTASIS (ARGON PLASMA COAGULATION/BICAP);  Surgeon: Irving Copas., MD;  Location: [facility];  Service: Gastroenterology;  Laterality: N/A;  ICV EMR   KNEE SURGERY Left 2018   POLYPECTOMY  01/01/2022   Procedure: POLYPECTOMY;  Surgeon: Mansouraty, Telford Nab., MD;  Location: [facility];  Service: Gastroenterology;;  cecal   SUBMUCOSAL LIFTING INJECTION  01/01/2022   Procedure: SUBMUCOSAL LIFTING INJECTION;  Surgeon: Irving Copas., MD;  Location: [facility];  Service: Gastroenterology;;  ICV EMR   Current Facility-Administered Medications  Medication Dose Route Frequency Provider Last Rate Last Admin   0.9 %  sodium chloride infusion   Intravenous Continuous Mansouraty, Telford Nab., MD       lactated ringers infusion    Continuous PRN Mansouraty, Telford Nab., MD 10 mL/hr at 06/24/22 0703 1,000 mL at 06/24/22 0703    Current Facility-Administered Medications:    0.9 %  sodium  chloride infusion, , Intravenous, Continuous, Mansouraty, Telford Nab., MD   lactated ringers infusion, , , Continuous PRN, Mansouraty, Telford Nab., MD, Last Rate: 10 mL/hr at 06/24/22 0703, 1,000 mL at 06/24/22 0703 No Known Allergies Family History  Problem Relation Age of Onset   Heart disease Father    Heart disease Paternal Grandmother    Heart disease Paternal Grandfather    Colon cancer Neg Hx    Colon polyps Neg Hx    Esophageal cancer Neg Hx    Rectal cancer Neg Hx    Stomach cancer Neg Hx    Inflammatory bowel disease Neg Hx    Liver disease Neg Hx    Pancreatic cancer Neg Hx    Social History   Socioeconomic History   Marital status: Single    Spouse name: Not on file   Number of children: Not on file   Years of education: Not on file   Highest education level: Not on file  Occupational History   Not on file  Tobacco Use   Smoking status: Never   Smokeless tobacco: Never  Vaping Use   Vaping Use: Never used  Substance and Sexual Activity   Alcohol use: Never   Drug use: Never   Sexual activity: Not on file  Other Topics Concern   Not on file  Social History Narrative   Not on file   Social Determinants of Health   Financial Resource Strain: Not on file  Food Insecurity: Not on file  Transportation Needs: Not on file  Physical Activity: Not  on file  Stress: Not on file  Social Connections: Not on file  Intimate Partner Violence: Not on file    Physical Exam: Today's Vitals   06/17/22 1249 06/24/22 0648  BP:  (!) 156/99  Pulse:  87  Resp:  12  Temp:  (!) 97.5 F (36.4 C)  TempSrc:  Tympanic  SpO2:  96%  Weight: 117.9 kg 117.9 kg  Height:  5' 11.5" (1.816 m)  PainSc:  0-No pain   Body mass index is 35.75 kg/m. GEN: NAD EYE: Sclerae anicteric ENT: MMM CV: Non-tachycardic GI: Soft, NT/ND NEURO:  Alert & Oriented x 3  Lab Results: No results for input(s): "WBC", "HGB", "HCT", "PLT" in the last 72 hours. BMET No results for input(s):  "NA", "K", "CL", "CO2", "GLUCOSE", "BUN", "CREATININE", "CALCIUM" in the last 72 hours. LFT No results for input(s): "PROT", "ALBUMIN", "AST", "ALT", "ALKPHOS", "BILITOT", "BILIDIR", "IBILI" in the last 72 hours. PT/INR No results for input(s): "LABPROT", "INR" in the last 72 hours.   Impression / Plan: This is a 51 y.o.male who presents for Colonoscopy for surveillance of previous ICV/AC/TI TVA s/p piecemeal EMR in 2022.   The risks and benefits of endoscopic evaluation/treatment were discussed with the patient and/or family; these include but are not limited to the risk of perforation, infection, bleeding, missed lesions, lack of diagnosis, severe illness requiring hospitalization, as well as anesthesia and sedation related illnesses.  The patient's history has been reviewed, patient examined, no change in status, and deemed stable for procedure.  The patient and/or family is agreeable to proceed.    Justice Britain, MD [facility] Office # [phone]

## 2022-06-24 NOTE — Op Note (Signed)
[facility] Patient Name: Jesse [name] Procedure Date: 06/24/2022 MRN: [id number] Attending MD: Justice Britain , MD, [id number] Date of Birth: 09/22/1970 CSN: [id number] Age: 51 Admit Type: Outpatient Procedure:                Colonoscopy Indications:              Surveillance: Personal history of piecemeal removal                            of large sessile adenoma on last colonoscopy 6                            months ago Providers:                Justice Britain, MD, Jeanella Cara, RN,                            William Dalton, Technician Referring MD:             Estill Cotta. Loletha Carrow, MD Medicines:                Monitored Anesthesia Care Complications:            No immediate complications. Estimated Blood Loss:     Estimated blood loss was minimal. Procedure:                Pre-Anesthesia Assessment:                           - Prior to the procedure, a History and Physical                            was performed, and patient medications and                            allergies were reviewed. The patient's tolerance of                            previous anesthesia was also reviewed. The risks                            and benefits of the procedure and the sedation                            options and risks were discussed with the patient.                            All questions were answered, and informed consent                            was obtained. Prior Anticoagulants: The patient has                            taken no anticoagulant or antiplatelet agents. ASA  Grade Assessment: II - A patient with mild systemic                            disease. After reviewing the risks and benefits,                            the patient was deemed in satisfactory condition to                            undergo the procedure.                           After obtaining informed consent, the colonoscope                             was passed under direct vision. Throughout the                            procedure, the patient's blood pressure, pulse, and                            oxygen saturations were monitored continuously. The                            CF-HQ190L ([id number] Olympus colonoscope was                            introduced through the anus and advanced to the 7                            cm into the ileum. The colonoscopy was performed                            without difficulty. The patient tolerated the                            procedure. The quality of the bowel preparation was                            adequate. The terminal ileum, ileocecal valve,                            appendiceal orifice, and rectum were photographed. Scope In: 7:47:25 AM Scope Out: 8:32:03 AM Scope Withdrawal Time: 0 hours 40 minutes 55 seconds  Total Procedure Duration: 0 hours 44 minutes 38 seconds  Findings:      The digital rectal exam findings include hemorrhoids. Pertinent       negatives include no palpable rectal lesions.      The terminal ileum appeared mostly normal.      The ileocecal valve contained one semi-sessile, non-bleeding polyp. The       polyp was 20 mm in diameter. This extended into the very distal terminal       ileum (at times, it would prolapse completely into the ICV and not be  visible). Preparations were made for further attempt at resection of       this recurrent polyp. Demarcation of the lesion was performed with       high-definition white light and narrow band imaging to clearly identify       the boundaries of the lesion. EverLift was injected to raise the lesion       out of the terminal ileum, with partial success (could feel the fibrosis       from prior). Piecemeal mucosal resection using a snare was then       performed. Resection was incomplete with this method. The resected       tissue was retrieved. Fulguration to ablate the lesion remnants by       forcep  avulsion was then felt to be successful. Coagulation for tissue       destruction using snare tip soft coagulation to the margins was then       performed. To prevent bleeding post-intervention, one hemostatic clip       was successfully placed (MR conditional). Clip manufacturer: [employer]. There was no bleeding at the end of the procedure. I could       not close the entire lesion due to the ileocecal valve being involved       within the TI.      A 6 mm polyp was found in the cecum. The polyp was sessile. The polyp       was removed with a cold snare. Resection and retrieval were complete.      Normal mucosa was found in the entire colon otherwise.      Non-bleeding non-thrombosed external and internal hemorrhoids were found       during retroflexion, during perianal exam and during digital exam. The       hemorrhoids were Grade II (internal hemorrhoids that prolapse but reduce       spontaneously). Impression:               - Hemorrhoids found on digital rectal exam.                           - The majority of the examined portion of the ileum                            was normal.                           - One polyp at the ileocecal valve with extension                            unfortunately (as before) into the very distal                            terminal ileum was found. This is partial                            recurrence, removed with piecemeal mucosal                            resection with snare. Then forcep avulsion.  Resected tissue retrieved. Treated with snare tip                            soft coagulation to the margins. One clip was                            placed.                           - One 6 mm polyp in the cecum, removed with a cold                            snare. Resected and retrieved.                           - Normal mucosa in the entire examined colon                            otherwise.                            - Non-bleeding non-thrombosed external and internal                            hemorrhoids. Moderate Sedation:      Not Applicable - Patient had care per Anesthesia. Recommendation:           - The patient will be observed post-procedure,                            until all discharge criteria are met.                           - Discharge patient to home.                           - Patient has a contact number available for                            emergencies. The signs and symptoms of potential                            delayed complications were discussed with the                            patient. Return to normal activities tomorrow.                            Written discharge instructions were provided to the                            patient.                           - High fiber diet.                           -  Use FiberCon 1-2 tablets PO daily.                           - Minimize NSAIDs for at least 1-2 weeks as able.                           - Await pathology results.                           - Repeat colonoscopy in 6-9 months for surveillance                            pending final pathology.                           - The findings and recommendations were discussed                            with the patient.                           - The findings and recommendations were discussed                            with the patient's family. Procedure Code(s):        --- Professional ---                           [phone], Colonoscopy, flexible; with endoscopic                            mucosal resection                           45385, 63, Colonoscopy, flexible; with removal of                            tumor(s), polyp(s), or other lesion(s) by snare                            technique Diagnosis Code(s):        --- Professional ---                           K64.1, Second degree hemorrhoids                           D13.39, Benign neoplasm of other  parts of small                            intestine                           D12.0, Benign neoplasm of cecum                           Z09, Encounter for follow-up examination after  completed treatment for conditions other than                            malignant neoplasm                           Z86.010, Personal history of colonic polyps CPT copyright 2022 [employer]. All rights reserved. The codes documented in this report are preliminary and upon coder review may  be revised to meet current compliance requirements. Justice Britain, MD 06/24/2022 9:00:09 AM Number of Addenda: 0

## 2022-06-25 LAB — SURGICAL PATHOLOGY

## 2022-06-27 ENCOUNTER — Encounter: Payer: Self-pay | Admitting: Gastroenterology

## 2022-06-27 ENCOUNTER — Encounter (HOSPITAL_COMMUNITY): Payer: Self-pay | Admitting: Gastroenterology

## 2022-07-02 DIAGNOSIS — J019 Acute sinusitis, unspecified: Secondary | ICD-10-CM | POA: Diagnosis not present

## 2022-07-30 DIAGNOSIS — M1611 Unilateral primary osteoarthritis, right hip: Secondary | ICD-10-CM | POA: Diagnosis not present

## 2022-08-15 DIAGNOSIS — F4323 Adjustment disorder with mixed anxiety and depressed mood: Secondary | ICD-10-CM | POA: Diagnosis not present

## 2022-08-27 DIAGNOSIS — F4323 Adjustment disorder with mixed anxiety and depressed mood: Secondary | ICD-10-CM | POA: Diagnosis not present

## 2022-08-29 ENCOUNTER — Ambulatory Visit: Payer: BC Managed Care – PPO | Admitting: Medical

## 2022-08-30 ENCOUNTER — Encounter: Payer: Self-pay | Admitting: Family Medicine

## 2022-08-30 ENCOUNTER — Ambulatory Visit (INDEPENDENT_AMBULATORY_CARE_PROVIDER_SITE_OTHER): Payer: BC Managed Care – PPO | Admitting: Family Medicine

## 2022-08-30 VITALS — BP 147/77 | HR 78 | Temp 98.2°F | Resp 16 | Ht 72.0 in | Wt 283.6 lb

## 2022-08-30 DIAGNOSIS — J0141 Acute recurrent pansinusitis: Secondary | ICD-10-CM

## 2022-08-30 MED ORDER — DOXYCYCLINE HYCLATE 100 MG PO TABS: 100.0000 mg | ORAL_TABLET | Freq: Two times a day (BID) | ORAL | 0 refills | Status: AC

## 2022-08-30 NOTE — Patient Instructions (Signed)
Adding doxycycline Continue supportive measures including rest, hydration, humidifier use, steam showers, warm compresses to sinuses, warm liquids with lemon and honey, and over-the-counter cough, cold, and analgesics as needed.      The following information is provided as a Human resources officer for ADULT patients only and does NOT take into account PREGNANCY, ALLERGIES, LIVER CONDITIONS, KIDNEY CONDITIONS, GASTROINTESTINAL CONDITIONS, OR PRESCRIPTION MEDICATION INTERACTIONS. Please be sure to ask your provider if the following are safe to take with your specific medical history, conditions, or current medication regimen if you are unsure.   Adult Basic Symptom Management for Sinusitis  Congestion: Guaifenesin (Mucinex)- follow directions on packaging with a maximum dose of 2481m in a 24 hour period.  Pain/Fever: Ibuprofen 2030m- 40019mvery 4-6 hours as needed (MAX 1200m87m a 24 hour period) Pain/Fever: Tylenol 500mg68m00mg 61my 6-8 hours as needed (MAX 3000mg i42m24 hour period)  Cough: Dextromethorphan (Delsym)- follow directions on packing with a maximum dose of 120mg in18m4 hour period.  Nasal Stuffiness: Saline nasal spray and/or Nettie Pot with sterile saline solution  Runny Nose: Fluticasone nasal spray (Flonase) OR Mometasone nasal spray (Nasonex) OR Triamcinolone Acetonide nasal spray (Nasacort)- follow directions on the packaging  Pain/Pressure: Warm washcloth to the face  Sore Throat: Warm salt water gargles  If you have allergies, you may also consider taking an oral antihistamine (like Zyrtec or Claritin) as these may also help with your symptoms.  **Many medications will have more than one ingredient, be sure you are reading the packaging carefully and not taking more than one dose of the same kind of medication at the same time or too close together. It is OK to use formulas that have all of the ingredients you want, but do not take them in a combined medication and as  separate dose too close together. If you have any questions, the pharmacist will be happy to help you decide what is safe.

## 2022-08-30 NOTE — Progress Notes (Signed)
Acute Office Visit  Subjective:     Patient ID: Jesse [name], male    DOB: 1971-01-11, 52 y.o.   MRN: [id number]  Chief Complaint  Patient presents with   Sinusitis     Patient is in today for sinus problems.   Patient reports he did a televisit for sinus infection 2 weeks ago and finished a 10-day course of amoxicillin. Within a day of stopping antibiotics, symptoms returned even worse. He has been feeling significant sinus pressure, nasal congestion, occasional cough. No recent fevers, chills, chest pain, dyspnea. Reports that this was his second course of amoxicillin within about 6 weeks.    ROS All review of systems negative except what is listed in the HPI      Objective:    BP (!) 147/77   Pulse 78   Temp 98.2 F (36.8 C)   Resp 16   Ht 6' (1.829 m)   Wt 283 lb 9.6 oz (128.6 kg)   SpO2 98%   BMI 38.46 kg/m    Physical Exam Vitals reviewed.  Constitutional:      Appearance: Normal appearance.  HENT:     Head:     Comments: Tenderness to frontal/maxillary sinus percussion    Nose: Congestion present.     Mouth/Throat:     Mouth: Mucous membranes are moist.     Pharynx: Oropharynx is clear. No posterior oropharyngeal erythema.  Cardiovascular:     Rate and Rhythm: Normal rate and regular rhythm.     Pulses: Normal pulses.     Heart sounds: Normal heart sounds.  Pulmonary:     Effort: Pulmonary effort is normal.     Breath sounds: Normal breath sounds.  Musculoskeletal:     Cervical back: Normal range of motion and neck supple. No tenderness.  Lymphadenopathy:     Cervical: No cervical adenopathy.  Skin:    General: Skin is warm and dry.  Neurological:     Mental Status: He is alert and oriented to person, place, and time.  Psychiatric:        Mood and Affect: Mood normal.        Behavior: Behavior normal.        Thought Content: Thought content normal.        Judgment: Judgment normal.     No results found for any visits on 08/30/22.       Assessment & Plan:   Problem List Items Addressed This Visit   None Visit Diagnoses     Acute recurrent pansinusitis    -  Primary   Relevant Medications   doxycycline (VIBRA-TABS) 100 MG tablet     Adding doxycycline Continue supportive measures including rest, hydration, humidifier use, steam showers, warm compresses to sinuses, warm liquids with lemon and honey, and over-the-counter cough, cold, and analgesics as needed.   *BP is elevated. Instructed to monitor every day or so for the next two week and send MyChart message with list of readings. If unable to do this, schedule BP check in 2 weeks.   Meds ordered this encounter  Medications   doxycycline (VIBRA-TABS) 100 MG tablet    Sig: Take 1 tablet (100 mg total) by mouth 2 (two) times daily for 10 days.    Dispense:  20 tablet    Refill:  0    Order Specific Question:   Supervising Provider    Answer:   Penni Homans A [[id number]]     Return if symptoms worsen or fail to  improve.  Terrilyn Saver, NP

## 2022-09-12 ENCOUNTER — Other Ambulatory Visit: Payer: Self-pay | Admitting: Family Medicine

## 2022-09-13 NOTE — Telephone Encounter (Signed)
Called and scheduled appt

## 2022-09-13 NOTE — Telephone Encounter (Signed)
Plz make sure he schedules his 6 mo f/u, which would be 3/14 roughly. Ty.

## 2022-09-19 ENCOUNTER — Other Ambulatory Visit: Payer: Self-pay | Admitting: Family Medicine

## 2022-10-02 ENCOUNTER — Encounter: Payer: Self-pay | Admitting: Family Medicine

## 2022-10-02 ENCOUNTER — Ambulatory Visit (INDEPENDENT_AMBULATORY_CARE_PROVIDER_SITE_OTHER): Payer: BC Managed Care – PPO | Admitting: Family Medicine

## 2022-10-02 VITALS — BP 121/80 | HR 94 | Temp 98.1°F | Ht 72.0 in | Wt 283.4 lb

## 2022-10-02 DIAGNOSIS — E559 Vitamin D deficiency, unspecified: Secondary | ICD-10-CM | POA: Diagnosis not present

## 2022-10-02 DIAGNOSIS — I10 Essential (primary) hypertension: Secondary | ICD-10-CM | POA: Diagnosis not present

## 2022-10-02 DIAGNOSIS — E1169 Type 2 diabetes mellitus with other specified complication: Secondary | ICD-10-CM | POA: Diagnosis not present

## 2022-10-02 DIAGNOSIS — Z23 Encounter for immunization: Secondary | ICD-10-CM | POA: Diagnosis not present

## 2022-10-02 DIAGNOSIS — B351 Tinea unguium: Secondary | ICD-10-CM

## 2022-10-02 DIAGNOSIS — E669 Obesity, unspecified: Secondary | ICD-10-CM

## 2022-10-02 DIAGNOSIS — M1A9XX0 Chronic gout, unspecified, without tophus (tophi): Secondary | ICD-10-CM

## 2022-10-02 DIAGNOSIS — L309 Dermatitis, unspecified: Secondary | ICD-10-CM

## 2022-10-02 MED ORDER — CLOTRIMAZOLE-BETAMETHASONE 1-0.05 % EX CREA: 1.0000 | TOPICAL_CREAM | Freq: Two times a day (BID) | CUTANEOUS | 0 refills | Status: DC

## 2022-10-02 NOTE — Patient Instructions (Addendum)
Give [place] 2-3 business days to get the results of your labs back.   Keep the diet clean and stay active.  Consider Vick's VapoRub daily for 9-15 months for the toenail.   Please schedule your eye exam.   Let us know if you need anything.

## 2022-10-02 NOTE — Progress Notes (Signed)
Subjective:   Chief Complaint  Patient presents with   Follow-up    Jesse [name] is a 52 y.o. male here for follow-up of diabetes.   Jesse [name] does not routinely check his sugars.  Patient does not require insulin.   Medications include: Ozempic 1 mg/week Diet is OK.  Exercise: limited  Hypertension Patient presents for hypertension follow up. He does not monitor home blood pressures. He is compliant with medications-Zestoretic 20-12.5 mg daily. Patient has these side effects of medication: none Diet/exercise as above. No Cp or SOB.   Over the past several months the patient has been dealing with toenail fungus on his right great toe.  There is no pain.  He discussed colored and slightly yellow.  He has not tried anything at home so far.  Over the past 2 months, the patient has had itching over the bottom of his scrotum.  There is no pain, drainage, or swelling.  He has tried over-the-counter jock itch treatment without relief.  No new lotions, soaps, topicals, or detergents.  No urinary issues.  Past Medical History:  Diagnosis Date   Arthritis    Diabetes mellitus without complication (HCC)    Gout    Hyperlipidemia    Hypertension    Sleep apnea    WEARS C PAP     Related testing: Retinal exam: Done Pneumovax: done  Objective:  BP 121/80 (BP Location: Left Arm, Patient Position: Sitting, Cuff Size: Large)   Pulse 94   Temp 98.1 F (36.7 C) (Oral)   Ht 6' (1.829 m)   Wt 283 lb 6 oz (128.5 kg)   SpO2 97%   BMI 38.43 kg/m  General:  Well developed, well nourished, in no apparent distress Skin: Thickened great toenail on the left with yellowing, no obvious deformity otherwise, no surrounding erythema of the soft tissue Over the posterior portion of the scrotum centrally, there are hypopigmented patches and macules with slight scaling.  There is no erythema, fluctuance, or TTP Lungs:  CTAB, no access msc use Cardio:  RRR, no bruits, no LE edema Musculoskeletal:   Symmetrical muscle groups noted without atrophy or deformity Neuro:  Sensation intact to pinprick on feet Psych: Age appropriate judgment and insight  Assessment:   Diabetes mellitus type 2 in obese ([place]) - Plan: Comprehensive metabolic panel, Lipid panel, Hemoglobin A1c  Essential hypertension  Chronic gout without tophus, unspecified cause, unspecified site - Plan: Uric acid  Vitamin D deficiency - Plan: VITAMIN D 25 Hydroxy (Vit-D Deficiency, Fractures)  Dermatitis - Plan: clotrimazole-betamethasone (LOTRISONE) cream  Need for Tdap vaccination - Plan: Tdap vaccine greater than or equal to 7yo IM  Onychomycosis   Plan:   Chronic, hopefully stable.  Continue Ozempic 1 mg weekly.  Counseled on diet and exercise. Chronic, stable.  Continue Zestoretic 20-12.5 mg daily. Check uric acid, has not had a recent flare. Check vitamin D given history of deficiency. Twice daily Lotrisone for the next 10 to 14 days.  Avoid scented products.  Try not to scratch. We discussed doing nothing versus topical therapy versus oral medication.  He opted for topical therapy for now.  He will let me know if he changes his mind. Tetanus shot today. F/u in 6 mo. The patient voiced understanding and agreement to the plan.  [place], DO 10/02/22 4:57 PM

## 2022-10-03 ENCOUNTER — Other Ambulatory Visit: Payer: Self-pay | Admitting: Family Medicine

## 2022-10-03 LAB — COMPREHENSIVE METABOLIC PANEL
ALT: 20 U/L (ref 0–53)
AST: 31 U/L (ref 0–37)
Albumin: 4.5 g/dL (ref 3.5–5.2)
Alkaline Phosphatase: 72 U/L (ref 39–117)
BUN: 12 mg/dL (ref 6–23)
CO2: 27 mEq/L (ref 19–32)
Calcium: 9.8 mg/dL (ref 8.4–10.5)
Chloride: 97 mEq/L (ref 96–112)
Creatinine, Ser: 1.05 mg/dL (ref 0.40–1.50)
GFR: 82.35 mL/min (ref >60.00)
Glucose, Bld: 152 mg/dL — ABNORMAL HIGH (ref 70–99)
Potassium: 3.8 mEq/L (ref 3.5–5.1)
Sodium: 135 mEq/L (ref 135–145)
Total Bilirubin: 0.6 mg/dL (ref 0.2–1.2)
Total Protein: 7.8 g/dL (ref 6.0–8.3)

## 2022-10-03 LAB — LIPID PANEL
Cholesterol: 236 mg/dL — ABNORMAL HIGH (ref 0–200)
HDL: 29.4 mg/dL — ABNORMAL LOW (ref >39.00)
Total CHOL/HDL Ratio: 8
Triglycerides: 1643 mg/dL — ABNORMAL HIGH (ref 0.0–149.0)

## 2022-10-03 LAB — URIC ACID: Uric Acid, Serum: 9.2 mg/dL — ABNORMAL HIGH (ref 4.0–7.8)

## 2022-10-03 LAB — VITAMIN D 25 HYDROXY (VIT D DEFICIENCY, FRACTURES): VITD: 13.12 ng/mL — ABNORMAL LOW (ref 30.00–100.00)

## 2022-10-03 LAB — HEMOGLOBIN A1C: Hgb A1c MFr Bld: 7.4 % — ABNORMAL HIGH (ref 4.6–6.5)

## 2022-10-03 LAB — LDL CHOLESTEROL, DIRECT: Direct LDL: 42 mg/dL

## 2022-10-03 MED ORDER — VITAMIN D (ERGOCALCIFEROL) 1.25 MG (50000 UNIT) PO CAPS: ORAL_CAPSULE | ORAL | 2 refills | Status: DC

## 2022-10-16 ENCOUNTER — Encounter: Payer: Self-pay | Admitting: Family Medicine

## 2022-11-04 ENCOUNTER — Other Ambulatory Visit: Payer: Self-pay | Admitting: Family Medicine

## 2022-11-04 DIAGNOSIS — E119 Type 2 diabetes mellitus without complications: Secondary | ICD-10-CM | POA: Diagnosis not present

## 2022-11-04 DIAGNOSIS — E1169 Type 2 diabetes mellitus with other specified complication: Secondary | ICD-10-CM

## 2022-11-29 ENCOUNTER — Telehealth: Payer: Self-pay | Admitting: Gastroenterology

## 2022-11-29 NOTE — Telephone Encounter (Signed)
Inbound call from patient, is requesting to schedule his recall colonoscopy at the hospital. Patient is due in September but knows there is a list.

## 2022-11-29 NOTE — Telephone Encounter (Signed)
The pt has been advised that he will be contacted when he is due for repeat.  He is due in September.

## 2022-12-05 ENCOUNTER — Other Ambulatory Visit: Payer: Self-pay

## 2022-12-05 DIAGNOSIS — Z86010 Personal history of colonic polyps: Secondary | ICD-10-CM

## 2022-12-05 DIAGNOSIS — D126 Benign neoplasm of colon, unspecified: Secondary | ICD-10-CM

## 2022-12-25 ENCOUNTER — Other Ambulatory Visit: Payer: Self-pay | Admitting: Family Medicine

## 2022-12-25 DIAGNOSIS — E1169 Type 2 diabetes mellitus with other specified complication: Secondary | ICD-10-CM

## 2022-12-25 MED ORDER — ATORVASTATIN CALCIUM 40 MG PO TABS
40.0000 mg | ORAL_TABLET | Freq: Every day | ORAL | 3 refills | Status: DC
Start: 2022-12-25 — End: 2023-12-22

## 2022-12-31 ENCOUNTER — Other Ambulatory Visit: Payer: Self-pay | Admitting: Family Medicine

## 2022-12-31 MED ORDER — ALLOPURINOL 300 MG PO TABS: 300.0000 mg | ORAL_TABLET | Freq: Every day | ORAL | 3 refills | Status: DC

## 2023-01-13 ENCOUNTER — Other Ambulatory Visit: Payer: Self-pay | Admitting: Family Medicine

## 2023-01-13 ENCOUNTER — Encounter: Payer: Self-pay | Admitting: Family Medicine

## 2023-01-13 ENCOUNTER — Ambulatory Visit (INDEPENDENT_AMBULATORY_CARE_PROVIDER_SITE_OTHER): Payer: BC Managed Care – PPO | Admitting: Family Medicine

## 2023-01-13 VITALS — BP 138/82 | HR 76 | Temp 98.4°F | Ht 72.0 in | Wt 281.4 lb

## 2023-01-13 DIAGNOSIS — R635 Abnormal weight gain: Secondary | ICD-10-CM | POA: Diagnosis not present

## 2023-01-13 DIAGNOSIS — E559 Vitamin D deficiency, unspecified: Secondary | ICD-10-CM

## 2023-01-13 DIAGNOSIS — E1169 Type 2 diabetes mellitus with other specified complication: Secondary | ICD-10-CM | POA: Diagnosis not present

## 2023-01-13 DIAGNOSIS — M5416 Radiculopathy, lumbar region: Secondary | ICD-10-CM

## 2023-01-13 DIAGNOSIS — Z7985 Long-term (current) use of injectable non-insulin antidiabetic drugs: Secondary | ICD-10-CM

## 2023-01-13 DIAGNOSIS — M1A9XX0 Chronic gout, unspecified, without tophus (tophi): Secondary | ICD-10-CM

## 2023-01-13 DIAGNOSIS — E782 Mixed hyperlipidemia: Secondary | ICD-10-CM

## 2023-01-13 DIAGNOSIS — E669 Obesity, unspecified: Secondary | ICD-10-CM

## 2023-01-13 LAB — URIC ACID: Uric Acid, Serum: 5.2 mg/dL (ref 4.0–7.8)

## 2023-01-13 LAB — COMPREHENSIVE METABOLIC PANEL
ALT: 19 U/L (ref 0–53)
AST: 24 U/L (ref 0–37)
Albumin: 4.4 g/dL (ref 3.5–5.2)
Alkaline Phosphatase: 44 U/L (ref 39–117)
BUN: 13 mg/dL (ref 6–23)
CO2: 27 mEq/L (ref 19–32)
Calcium: 9.7 mg/dL (ref 8.4–10.5)
Chloride: 101 mEq/L (ref 96–112)
Creatinine, Ser: 1.04 mg/dL (ref 0.40–1.50)
GFR: 83.14 mL/min (ref >60.00)
Glucose, Bld: 167 mg/dL — ABNORMAL HIGH (ref 70–99)
Potassium: 3.9 mEq/L (ref 3.5–5.1)
Sodium: 137 mEq/L (ref 135–145)
Total Bilirubin: 0.6 mg/dL (ref 0.2–1.2)
Total Protein: 7.4 g/dL (ref 6.0–8.3)

## 2023-01-13 LAB — HEMOGLOBIN A1C: Hgb A1c MFr Bld: 6.9 % — ABNORMAL HIGH (ref 4.6–6.5)

## 2023-01-13 LAB — LIPID PANEL
Cholesterol: 130 mg/dL (ref 0–200)
HDL: 21.5 mg/dL — ABNORMAL LOW (ref >39.00)
Total CHOL/HDL Ratio: 6
Triglycerides: 667 mg/dL — ABNORMAL HIGH (ref 0.0–149.0)

## 2023-01-13 LAB — T4, FREE: Free T4: 0.67 ng/dL (ref 0.60–1.60)

## 2023-01-13 LAB — VITAMIN D 25 HYDROXY (VIT D DEFICIENCY, FRACTURES): VITD: 19.28 ng/mL — ABNORMAL LOW (ref 30.00–100.00)

## 2023-01-13 LAB — TSH: TSH: 2 u[IU]/mL (ref 0.35–5.50)

## 2023-01-13 LAB — LDL CHOLESTEROL, DIRECT: Direct LDL: 39 mg/dL

## 2023-01-13 MED ORDER — VITAMIN D (ERGOCALCIFEROL) 1.25 MG (50000 UNIT) PO CAPS: ORAL_CAPSULE | ORAL | 1 refills | Status: DC

## 2023-01-13 MED ORDER — ICOSAPENT ETHYL 1 G PO CAPS: 2.0000 g | ORAL_CAPSULE | Freq: Two times a day (BID) | ORAL | 3 refills | Status: DC

## 2023-01-13 NOTE — Patient Instructions (Addendum)
Give us 2-3 business days to get the results of your labs back. Our follow up will be based on your results.   Keep the diet clean and stay active.  If you do not hear anything about your referral in the next 1-2 weeks, call our office and ask for an update.  Let us know if you need anything.  

## 2023-01-13 NOTE — Progress Notes (Signed)
Subjective:   Chief Complaint  Patient presents with   Follow-up    Quientin Jent is a 52 y.o. male here for follow-up of diabetes.   Jaston does not routinely monitor his sugars.  Patient does not require insulin.   Medications include: Ozempic 1 mg/week Diet is fair.  Exercise: walking No CP or SOB.   Hyperlipidemia Patient presents for dyslipidemia follow up. Currently being treated with Lipitor 40 mg/d, Lifibra 200 mg/d and compliance with treatment thus far has been good. He denies myalgias. Diet/exercise as above.  The patient is not known to have coexisting coronary artery disease.  Gout Allopurinol 300 mg/d. Usually affects his knees and toes. Did have a flare since his last visit 3 mo ago. Compliant, no AE's w med.   Past Medical History:  Diagnosis Date   Arthritis    Diabetes mellitus without complication (HCC)    Gout    Hyperlipidemia    Hypertension    Sleep apnea    WEARS C PAP     Related testing: Retinal exam: Done Pneumovax: done  Objective:  BP 138/82 (BP Location: Left Arm, Patient Position: Sitting, Cuff Size: Large)   Pulse 76   Temp 98.4 F (36.9 C) (Oral)   Ht 6' (1.829 m)   Wt 281 lb 6 oz (127.6 kg)   SpO2 98%   BMI 38.16 kg/m  General:  Well developed, well nourished, in no apparent distress Lungs:  CTAB, no access msc use Cardio:  RRR, no bruits, no LE edema Psych: Age appropriate judgment and insight  Assessment:   Type 2 diabetes mellitus with obesity (HCC) - Plan: Lipid panel, Hemoglobin A1c, Comprehensive metabolic panel  Chronic gout without tophus, unspecified cause, unspecified site - Plan: Uric acid  Vitamin D deficiency - Plan: VITAMIN D 25 Hydroxy (Vit-D Deficiency, Fractures)  Weight gain - Plan: TSH, T4, free  Mixed hyperlipidemia  Lumbar radiculopathy - Plan: Ambulatory referral to Neurosurgery  Plan:   Chronic, hopefully stable. Probably improved but if not, could increase Ozempic vs adding [place].  Counseled on diet and exercise. Chronic, hopefully stable. Cont Allopurinol 300 mg/d for now, could add bid dosing. Need to ck Vit D, will see what his levels are and do another 12 weeks if he is sub-there.  Ck thyroid levels. Famhx of hypothyroid. Chronic, unsure if stable. Cont fenofibrate 200 mg/d, Lipitor 40 mg/d.  Would like 2nd opinion for his radiculopathy from seeing ortho spine. Not disgruntled, just wants to see what the best approach is. [facility] referral placed.  F/u in 3-6 mo. The patient voiced understanding and agreement to the plan.  Jilda Roche [place], DO 01/13/23 8:52 AM

## 2023-01-14 ENCOUNTER — Telehealth: Payer: Self-pay

## 2023-01-14 NOTE — Telephone Encounter (Signed)
PA initiated via Covermymeds; KEY: [id number].   PA approved   CaseId:[id number];Status:Approved;Review Type:Prior Auth;Coverage Start Date:12/15/2022;Coverage End Date:01/14/2024;

## 2023-02-06 DIAGNOSIS — H40003 Preglaucoma, unspecified, bilateral: Secondary | ICD-10-CM | POA: Diagnosis not present

## 2023-02-10 DIAGNOSIS — Z6839 Body mass index (BMI) 39.0-39.9, adult: Secondary | ICD-10-CM | POA: Diagnosis not present

## 2023-02-10 DIAGNOSIS — M5416 Radiculopathy, lumbar region: Secondary | ICD-10-CM | POA: Diagnosis not present

## 2023-02-11 ENCOUNTER — Other Ambulatory Visit: Payer: Self-pay | Admitting: Neurosurgery

## 2023-02-11 DIAGNOSIS — M5416 Radiculopathy, lumbar region: Secondary | ICD-10-CM

## 2023-02-27 DIAGNOSIS — E119 Type 2 diabetes mellitus without complications: Secondary | ICD-10-CM | POA: Insufficient documentation

## 2023-03-03 ENCOUNTER — Ambulatory Visit
Admission: RE | Admit: 2023-03-03 | Discharge: 2023-03-03 | Disposition: A | Discharge status: Home / Self Care | Payer: BC Managed Care – PPO | Source: Ambulatory Visit | Attending: Neurosurgery | Admitting: Neurosurgery

## 2023-03-03 ENCOUNTER — Other Ambulatory Visit: Payer: Self-pay | Admitting: Family Medicine

## 2023-03-03 DIAGNOSIS — I10 Essential (primary) hypertension: Secondary | ICD-10-CM

## 2023-03-03 DIAGNOSIS — M5416 Radiculopathy, lumbar region: Secondary | ICD-10-CM

## 2023-03-05 DIAGNOSIS — M5416 Radiculopathy, lumbar region: Secondary | ICD-10-CM | POA: Diagnosis not present

## 2023-03-05 DIAGNOSIS — R293 Abnormal posture: Secondary | ICD-10-CM | POA: Diagnosis not present

## 2023-03-05 DIAGNOSIS — M5451 Vertebrogenic low back pain: Secondary | ICD-10-CM | POA: Diagnosis not present

## 2023-03-05 DIAGNOSIS — M2569 Stiffness of other specified joint, not elsewhere classified: Secondary | ICD-10-CM | POA: Diagnosis not present

## 2023-03-06 ENCOUNTER — Encounter (INDEPENDENT_AMBULATORY_CARE_PROVIDER_SITE_OTHER): Payer: Self-pay

## 2023-03-10 DIAGNOSIS — M5451 Vertebrogenic low back pain: Secondary | ICD-10-CM | POA: Diagnosis not present

## 2023-03-10 DIAGNOSIS — M2569 Stiffness of other specified joint, not elsewhere classified: Secondary | ICD-10-CM | POA: Diagnosis not present

## 2023-03-10 DIAGNOSIS — M5416 Radiculopathy, lumbar region: Secondary | ICD-10-CM | POA: Diagnosis not present

## 2023-03-10 DIAGNOSIS — R293 Abnormal posture: Secondary | ICD-10-CM | POA: Diagnosis not present

## 2023-03-13 DIAGNOSIS — M5416 Radiculopathy, lumbar region: Secondary | ICD-10-CM | POA: Diagnosis not present

## 2023-03-13 DIAGNOSIS — R293 Abnormal posture: Secondary | ICD-10-CM | POA: Diagnosis not present

## 2023-03-13 DIAGNOSIS — M5451 Vertebrogenic low back pain: Secondary | ICD-10-CM | POA: Diagnosis not present

## 2023-03-13 DIAGNOSIS — M2569 Stiffness of other specified joint, not elsewhere classified: Secondary | ICD-10-CM | POA: Diagnosis not present

## 2023-03-14 ENCOUNTER — Ambulatory Visit (AMBULATORY_SURGERY_CENTER): Payer: BC Managed Care – PPO

## 2023-03-14 ENCOUNTER — Encounter: Payer: Self-pay | Admitting: Family Medicine

## 2023-03-14 VITALS — Ht 72.0 in | Wt 270.0 lb

## 2023-03-14 DIAGNOSIS — Z86010 Personal history of colonic polyps: Secondary | ICD-10-CM

## 2023-03-14 DIAGNOSIS — E1169 Type 2 diabetes mellitus with other specified complication: Secondary | ICD-10-CM

## 2023-03-14 MED ORDER — OZEMPIC (1 MG/DOSE) 4 MG/3ML ~~LOC~~ SOPN
1.0000 mg | PEN_INJECTOR | SUBCUTANEOUS | 0 refills | Status: DC
Start: 2023-03-14 — End: 2023-06-13

## 2023-03-14 MED ORDER — PEG 3350-KCL-NA BICARB-NACL 420 G PO SOLR: 4000.0000 mL | Freq: Once | ORAL | 0 refills | Status: AC

## 2023-03-14 NOTE — Progress Notes (Addendum)
Pre visit completed via phone call; Patient verified name, DOB, and address;  No egg or soy allergy known to patient;  No issues known to pt with past sedation with any surgeries or procedures; Patient denies ever being told they had issues or difficulty with intubation;  No FH of Malignant Hyperthermia; Pt is not on diet pills; Pt is not on home 02;  Pt is not on blood thinners;  Pt denies issues with constipation;  No A fib or A flutter  Have any cardiac testing pending--NO Insurance verified during PV appt--- BCBS PPO  Pt can ambulate without assistance;  Pt denies use of chewing tobacco; Discussed diabetic/weight loss medication holds; Discussed NSAID holds; Checked BMI to be less than 50; Pt instructed to use Singlecare.com or GoodRx for a price reduction on prep;  Patient's chart reviewed by Cathlyn Parsons CNRA prior to previsit and patient appropriate for the [facility]; Pre visit completed and red dot placed by patient's name on their procedure day (on provider's schedule).    Instructions sent to patient via MyChart per his request;  Diabetes medication instructions added to new prep instructions sheet and sent to patient via MyChart in case hospital instructions not given in appropriate time frame prior to procedure;

## 2023-03-17 ENCOUNTER — Encounter: Payer: Self-pay | Admitting: Gastroenterology

## 2023-03-17 ENCOUNTER — Other Ambulatory Visit: Payer: Self-pay | Admitting: Family Medicine

## 2023-03-17 MED ORDER — ICOSAPENT ETHYL 1 G PO CAPS: 2.0000 g | ORAL_CAPSULE | Freq: Two times a day (BID) | ORAL | 1 refills | Status: DC

## 2023-03-19 ENCOUNTER — Other Ambulatory Visit: Payer: Self-pay | Admitting: Family Medicine

## 2023-03-19 NOTE — Telephone Encounter (Signed)
Last OV--12/24/2022 Last RF---09/13/2022--#30 with 3 refills

## 2023-03-20 ENCOUNTER — Encounter (HOSPITAL_COMMUNITY): Payer: Self-pay | Admitting: Gastroenterology

## 2023-03-20 NOTE — Progress Notes (Signed)
Attempted to obtain medical history via telephone, unable to reach at this time. HIPAA compliant voicemail message left requesting return call to pre surgical testing department. 

## 2023-03-31 ENCOUNTER — Ambulatory Visit (HOSPITAL_COMMUNITY): Payer: BC Managed Care – PPO

## 2023-03-31 ENCOUNTER — Encounter (HOSPITAL_COMMUNITY): Payer: Self-pay | Admitting: Gastroenterology

## 2023-03-31 ENCOUNTER — Ambulatory Visit (HOSPITAL_COMMUNITY)
Admission: RE | Admit: 2023-03-31 | Discharge: 2023-03-31 | Disposition: A | Discharge status: Home / Self Care | Payer: BC Managed Care – PPO | Attending: Gastroenterology | Admitting: Gastroenterology

## 2023-03-31 ENCOUNTER — Encounter (HOSPITAL_COMMUNITY)
Admission: RE | Disposition: A | Discharge status: Home / Self Care | Payer: Self-pay | Source: Home / Self Care | Attending: Gastroenterology

## 2023-03-31 ENCOUNTER — Other Ambulatory Visit: Payer: Self-pay

## 2023-03-31 DIAGNOSIS — K644 Residual hemorrhoidal skin tags: Secondary | ICD-10-CM | POA: Diagnosis not present

## 2023-03-31 DIAGNOSIS — I10 Essential (primary) hypertension: Secondary | ICD-10-CM | POA: Insufficient documentation

## 2023-03-31 DIAGNOSIS — K641 Second degree hemorrhoids: Secondary | ICD-10-CM | POA: Diagnosis not present

## 2023-03-31 DIAGNOSIS — Z09 Encounter for follow-up examination after completed treatment for conditions other than malignant neoplasm: Secondary | ICD-10-CM | POA: Insufficient documentation

## 2023-03-31 DIAGNOSIS — D1339 Benign neoplasm of other parts of small intestine: Secondary | ICD-10-CM | POA: Diagnosis not present

## 2023-03-31 DIAGNOSIS — E119 Type 2 diabetes mellitus without complications: Secondary | ICD-10-CM | POA: Diagnosis not present

## 2023-03-31 DIAGNOSIS — Z86010 Personal history of colonic polyps: Secondary | ICD-10-CM | POA: Diagnosis not present

## 2023-03-31 DIAGNOSIS — G4730 Sleep apnea, unspecified: Secondary | ICD-10-CM | POA: Diagnosis not present

## 2023-03-31 DIAGNOSIS — D120 Benign neoplasm of cecum: Secondary | ICD-10-CM | POA: Insufficient documentation

## 2023-03-31 HISTORY — PX: ENDOSCOPIC MUCOSAL RESECTION: SHX6839

## 2023-03-31 HISTORY — PX: HEMOSTASIS CONTROL: SHX6838

## 2023-03-31 HISTORY — PX: COLONOSCOPY WITH PROPOFOL: SHX5780

## 2023-03-31 LAB — GLUCOSE, CAPILLARY: Glucose-Capillary: 162 mg/dL — ABNORMAL HIGH (ref 70–99)

## 2023-03-31 SURGERY — COLONOSCOPY WITH PROPOFOL
Anesthesia: Monitor Anesthesia Care

## 2023-03-31 MED ORDER — PROPOFOL 10 MG/ML IV BOLUS: INTRAVENOUS | Status: DC | PRN

## 2023-03-31 MED ORDER — LIDOCAINE HCL (CARDIAC) PF 100 MG/5ML IV SOSY
PREFILLED_SYRINGE | INTRAVENOUS | Status: DC | PRN
  Administered 2023-03-31: 50 mg via INTRAVENOUS

## 2023-03-31 MED ORDER — PROPOFOL 10 MG/ML IV BOLUS
INTRAVENOUS | Status: DC | PRN
  Administered 2023-03-31: 20 mg via INTRAVENOUS
  Administered 2023-03-31: 160 mg via INTRAVENOUS
  Administered 2023-03-31: 20 mg via INTRAVENOUS
  Administered 2023-03-31: 40 mg via INTRAVENOUS

## 2023-03-31 MED ORDER — LACTATED RINGERS IV SOLN: INTRAVENOUS | Status: DC | PRN

## 2023-03-31 MED ORDER — PROPOFOL 500 MG/50ML IV EMUL
INTRAVENOUS | Status: DC | PRN
  Administered 2023-03-31: 150 ug/kg/min via INTRAVENOUS

## 2023-03-31 MED ORDER — PROPOFOL 1000 MG/100ML IV EMUL
INTRAVENOUS | Status: AC
  Filled 2023-03-31: qty 100

## 2023-03-31 SURGICAL SUPPLY — 22 items

## 2023-03-31 NOTE — H&P (Signed)
GASTROENTEROLOGY PROCEDURE H&P NOTE   Primary Care Physician: Sharlene Dory, DO  HPI: Jesse [name] is a 52 y.o. male who presents for Colonoscopy for evaluation of previously resected ICV polyp that had migrated into the small bowel as well as cecal /ascending sides of the ICV itself.  Past Medical History:  Diagnosis Date   Arthritis    Diabetes mellitus without complication (HCC)    on meds   Gout    Hyperlipidemia    on meds   Hypertension    on meds   Sleep apnea    uses CPAP   Past Surgical History:  Procedure Laterality Date   BICEPS TENDON REPAIR Left    COLONOSCOPY WITH PROPOFOL N/A 01/01/2022   Procedure: COLONOSCOPY WITH PROPOFOL;  Surgeon: Lemar Lofty., MD;  Location: [facility];  Service: Gastroenterology;  Laterality: N/A;   COLONOSCOPY WITH PROPOFOL N/A 06/24/2022   Procedure: COLONOSCOPY WITH PROPOFOL;  Surgeon: Meridee Score Netty Starring., MD;  Location: [facility];  Service: Gastroenterology;  Laterality: N/A;   ENDOSCOPIC MUCOSAL RESECTION N/A 01/01/2022   Procedure: ENDOSCOPIC MUCOSAL RESECTION;  Surgeon: Meridee Score Netty Starring., MD;  Location: [facility];  Service: Gastroenterology;  Laterality: N/A;  ICV polyp   ENDOSCOPIC MUCOSAL RESECTION N/A 06/24/2022   Procedure: ENDOSCOPIC MUCOSAL RESECTION;  Surgeon: Meridee Score Netty Starring., MD;  Location: [facility];  Service: Gastroenterology;  Laterality: N/A;   HEMOSTASIS CLIP PLACEMENT  06/24/2022   Procedure: HEMOSTASIS CLIP PLACEMENT;  Surgeon: Lemar Lofty., MD;  Location: [facility];  Service: Gastroenterology;;   HERNIA REPAIR  2019   HIP SURGERY Right 01/2022   HOT HEMOSTASIS N/A 01/01/2022   Procedure: HOT HEMOSTASIS (ARGON PLASMA COAGULATION/BICAP);  Surgeon: Lemar Lofty., MD;  Location: [facility];  Service: Gastroenterology;  Laterality: N/A;  ICV EMR   KNEE SURGERY Left 2018   POLYPECTOMY  01/01/2022   Procedure: POLYPECTOMY;  Surgeon: Mansouraty,  Netty Starring., MD;  Location: [facility];  Service: Gastroenterology;;  cecal   POLYPECTOMY  06/24/2022   Procedure: POLYPECTOMY;  Surgeon: Lemar Lofty., MD;  Location: [facility];  Service: Gastroenterology;;   Sunnie Nielsen LIFTING INJECTION  01/01/2022   Procedure: SUBMUCOSAL LIFTING INJECTION;  Surgeon: Lemar Lofty., MD;  Location: [facility];  Service: Gastroenterology;;  ICV EMR   SUBMUCOSAL LIFTING INJECTION  06/24/2022   Procedure: SUBMUCOSAL LIFTING INJECTION;  Surgeon: Lemar Lofty., MD;  Location: [facility];  Service: Gastroenterology;;   No current facility-administered medications for this encounter.   No current facility-administered medications for this encounter. No Known Allergies Family History  Problem Relation Age of Onset   Heart disease Father    Colon polyps Sister 14   Colon polyps Brother 30   Heart disease Paternal Grandmother    Heart disease Paternal Grandfather    Colon cancer Neg Hx    Esophageal cancer Neg Hx    Rectal cancer Neg Hx    Stomach cancer Neg Hx    Inflammatory bowel disease Neg Hx    Liver disease Neg Hx    Pancreatic cancer Neg Hx    Social History   Socioeconomic History   Marital status: Single    Spouse name: Not on file   Number of children: Not on file   Years of education: Not on file   Highest education level: Not on file  Occupational History   Not on file  Tobacco Use   Smoking status: Never   Smokeless tobacco: Never  Vaping Use   Vaping  status: Never Used  Substance and Sexual Activity   Alcohol use: Yes    Alcohol/week: 3.0 standard drinks of alcohol    Types: 3 Standard drinks or equivalent per week   Drug use: Never   Sexual activity: Not on file  Other Topics Concern   Not on file  Social History Narrative   Not on file   Social Determinants of Health   Financial Resource Strain: Not on file  Food Insecurity: Not on file  Transportation Needs: Not on file  Physical  Activity: Not on file  Stress: Not on file  Social Connections: Not on file  Intimate Partner Violence: Not on file    Physical Exam: Today's Vitals   03/31/23 0727  BP: (!) 148/78  Pulse: 80  Resp: (!) 22  Temp: 99.2 F (37.3 C)  TempSrc: Tympanic  SpO2: 97%  Weight: 122.5 kg  Height: 6' (1.829 m)  PainSc: 0-No pain   Body mass index is 36.63 kg/m. GEN: NAD EYE: Sclerae anicteric ENT: MMM CV: Non-tachycardic GI: Soft, NT/ND NEURO:  Alert & Oriented x 3  Lab Results: No results for input(s): "WBC", "HGB", "HCT", "PLT" in the last 72 hours. BMET No results for input(s): "NA", "K", "CL", "CO2", "GLUCOSE", "BUN", "CREATININE", "CALCIUM" in the last 72 hours. LFT No results for input(s): "PROT", "ALBUMIN", "AST", "ALT", "ALKPHOS", "BILITOT", "BILIDIR", "IBILI" in the last 72 hours. PT/INR No results for input(s): "LABPROT", "INR" in the last 72 hours.   Impression / Plan: This is a 52 y.o.male  who presents for Colonoscopy for evaluation of previously resected ICV polyp that had migrated into the small bowel as well as cecal /ascending sides of the ICV itself.  The risks and benefits of endoscopic evaluation/treatment were discussed with the patient and/or family; these include but are not limited to the risk of perforation, infection, bleeding, missed lesions, lack of diagnosis, severe illness requiring hospitalization, as well as anesthesia and sedation related illnesses.  The patient's history has been reviewed, patient examined, no change in status, and deemed stable for procedure.  The patient and/or family is agreeable to proceed.    Corliss Parish, MD [facility] Office # [phone]

## 2023-03-31 NOTE — Discharge Instructions (Signed)
YOU HAD AN ENDOSCOPIC PROCEDURE TODAY: Refer to the procedure report and other information in the discharge instructions given to you for any specific questions about what was found during the examination. If this information does not answer your questions, please call [place] office at [phone] to clarify.   YOU SHOULD EXPECT: Some feelings of bloating in the abdomen. Passage of more gas than usual. Walking can help get rid of the air that was put into your GI tract during the procedure and reduce the bloating. If you had a lower endoscopy (such as a colonoscopy or flexible sigmoidoscopy) you may notice spotting of blood in your stool or on the toilet paper. Some abdominal soreness may be present for a day or two, also.  DIET: Your first meal following the procedure should be a light meal and then it is ok to progress to your normal diet. A half-sandwich or bowl of soup is an example of a good first meal. Heavy or fried foods are harder to digest and may make you feel nauseous or bloated. Drink plenty of fluids but you should avoid alcoholic beverages for 24 hours.  ACTIVITY: Your care partner should take you home directly after the procedure. You should plan to take it easy, moving slowly for the rest of the day. You can resume normal activity the day after the procedure however YOU SHOULD NOT DRIVE, use power tools, machinery or perform tasks that involve climbing or major physical exertion for 24 hours (because of the sedation medicines used during the test).   SYMPTOMS TO REPORT IMMEDIATELY: A gastroenterologist can be reached at any hour. Please call [phone]  for any of the following symptoms:  Following lower endoscopy (colonoscopy, flexible sigmoidoscopy) Excessive amounts of blood in the stool  Significant tenderness, worsening of abdominal pains  Swelling of the abdomen that is new, acute  Fever of 100 or higher   FOLLOW UP:  If any biopsies were taken you will be contacted by  phone or by letter within the next 1-3 weeks. Call [phone]  if you have not heard about the biopsies in 3 weeks.  Please also call with any specific questions about appointments or follow up tests. 

## 2023-03-31 NOTE — Anesthesia Postprocedure Evaluation (Signed)
Anesthesia Post Note  Patient: Baran Klemz  Procedure(s) Performed: COLONOSCOPY WITH PROPOFOL ENDOSCOPIC MUCOSAL RESECTION POLYPECTOMY HEMOSTASIS CONTROL     Patient location during evaluation: PACU Anesthesia Type: MAC Level of consciousness: awake and alert Pain management: pain level controlled Vital Signs Assessment: post-procedure vital signs reviewed and stable Respiratory status: spontaneous breathing, nonlabored ventilation, respiratory function stable and patient connected to nasal cannula oxygen Cardiovascular status: stable and blood pressure returned to baseline Postop Assessment: no apparent nausea or vomiting Anesthetic complications: no  No notable events documented.  Last Vitals:  Vitals:   03/31/23 0850 03/31/23 0900  BP: 133/63 (!) 145/79  Pulse: 79 78  Resp: 17 19  Temp:    SpO2: 97% 96%    Last Pain:  Vitals:   03/31/23 0900  TempSrc:   PainSc: 0-No pain                 Jakson Delpilar S

## 2023-03-31 NOTE — Transfer of Care (Signed)
Immediate Anesthesia Transfer of Care Note  Patient: Jesse [name]  Procedure(s) Performed: COLONOSCOPY WITH PROPOFOL ENDOSCOPIC MUCOSAL RESECTION POLYPECTOMY HEMOSTASIS CONTROL  Patient Location: PACU  Anesthesia Type:MAC  Level of Consciousness: awake and alert   Airway & Oxygen Therapy: Patient Spontanous Breathing  Post-op Assessment: Report given to RN and Post -op Vital signs reviewed and stable  Post vital signs: Reviewed and stable  Last Vitals:  Vitals Value Taken Time  BP    Temp    Pulse 81 03/31/23 0841  Resp 19 03/31/23 0841  SpO2 98 % 03/31/23 0841  Vitals shown include unfiled device data.  Last Pain:  Vitals:   03/31/23 0727  TempSrc: Tympanic  PainSc: 0-No pain         Complications: No notable events documented.

## 2023-03-31 NOTE — Anesthesia Preprocedure Evaluation (Signed)
Anesthesia Evaluation  Patient identified by MRN, date of birth, ID band Patient awake    Reviewed: Allergy & Precautions, H&P , NPO status , Patient's Chart, lab work & pertinent test results  Airway Mallampati: II  TM Distance: >3 FB Neck ROM: Full    Dental no notable dental hx.    Pulmonary sleep apnea    Pulmonary exam normal breath sounds clear to auscultation       Cardiovascular hypertension, Normal cardiovascular exam Rhythm:Regular Rate:Normal     Neuro/Psych negative neurological ROS  negative psych ROS   GI/Hepatic negative GI ROS, Neg liver ROS,,,  Endo/Other  diabetes, Type 2    Renal/GU negative Renal ROS  negative genitourinary   Musculoskeletal negative musculoskeletal ROS (+)    Abdominal   Peds negative pediatric ROS (+)  Hematology negative hematology ROS (+)   Anesthesia Other Findings   Reproductive/Obstetrics negative OB ROS                             Anesthesia Physical Anesthesia Plan  ASA: 2  Anesthesia Plan: MAC   Post-op Pain Management: Minimal or no pain anticipated   Induction: Intravenous  PONV Risk Score and Plan: 1 and Propofol infusion and Treatment may vary due to age or medical condition  Airway Management Planned: Simple Face Mask  Additional Equipment:   Intra-op Plan:   Post-operative Plan:   Informed Consent: I have reviewed the patients History and Physical, chart, labs and discussed the procedure including the risks, benefits and alternatives for the proposed anesthesia with the patient or authorized representative who has indicated his/her understanding and acceptance.     Dental advisory given  Plan Discussed with: CRNA and Surgeon  Anesthesia Plan Comments:        Anesthesia Quick Evaluation

## 2023-03-31 NOTE — Op Note (Signed)
[facility] Patient Name: Jesse [name] Procedure Date: 03/31/2023 MRN: [id number] Attending MD: Corliss Parish , MD, [id number] Date of Birth: [birth year] CSN: [id number] Age: 52 Admit Type: Ambulatory Procedure:                Colonoscopy Indications:              Surveillance: Personal history of piecemeal removal                            of adenoma on last colonoscopy >6 months ago                            (previously noted ICV polyp that had encroached                            within the TI on first attempt at resection and at                            followup) Providers:                Corliss Parish, MD, Doristine Mango, RN,                            Jacquelyn "Jaci" Clelia Croft, RN, Marja Kays,                            Technician Referring MD:             Starr Lake. Myrtie Neither, MD, Jilda Roche Wendling Medicines:                Monitored Anesthesia Care Complications:            No immediate complications. Estimated Blood Loss:     Estimated blood loss was minimal. Procedure:                Pre-Anesthesia Assessment:                           - Prior to the procedure, a History and Physical                            was performed, and patient medications and                            allergies were reviewed. The patient's tolerance of                            previous anesthesia was also reviewed. The risks                            and benefits of the procedure and the sedation                            options and risks were discussed with the patient.  All questions were answered, and informed consent                            was obtained. Prior Anticoagulants: The patient has                            taken no anticoagulant or antiplatelet agents. ASA                            Grade Assessment: II - A patient with mild systemic                            disease. After reviewing the risks and benefits,                             the patient was deemed in satisfactory condition to                            undergo the procedure.                           After obtaining informed consent, the colonoscope                            was passed under direct vision. Throughout the                            procedure, the patient's blood pressure, pulse, and                            oxygen saturations were monitored continuously. The                            CF-HQ190L ([id number]) Olympus colonoscope was                            introduced through the anus and advanced to the 6                            cm into the ileum. The colonoscopy was performed                            without difficulty. The patient tolerated the                            procedure. The quality of the bowel preparation was                            good. The terminal ileum, ileocecal valve,                            appendiceal orifice, and rectum were photographed. Scope In: 8:06:07 AM Scope Out: 8:35:41 AM Scope Withdrawal Time: 0 hours 25 minutes 40 seconds  Total  Procedure Duration: 0 hours 29 minutes 34 seconds  Findings:      Skin tags were found on perianal exam.      The digital rectal exam findings include hemorrhoids. Pertinent       negatives include no palpable rectal lesions.      The terminal ileum appeared normal.      The ileocecal valve contained one semi-sessile, non-bleeding polypoid       recurrence. The polyp was 22 mm in diameter. It involves currently just       the IC valve within the cecum and the ascending colon sides, but did not       have deep penetration into the terminal ileum as it has looked like       previously. Preparations were made for further attempt at mucosal       resection. Demarcation of the lesion was performed with high-definition       white light and narrow band imaging to clearly identify the boundaries       of the lesion. It was clear that Endorotor would not be  helpful with how       nodular this area of recurrence was. Also, FTRD is not possible at this       region. It was scarred down significantly. We proceeded with using water       immersion and the majority of the nodular polypoid recurrence was able       to float/raise. Piecemeal mucosal resection using a snare was performed       (2 pieces). Resection and retrieval were complete. Resected tissue       margins were examined and clear of polyp tissue. Coagulation for tissue       destruction to the margin using snare tip soft coagulation was       successful. As this involves the ICV, I could not close this area with       just clips. The resection area was successfully injected with 2 mL       PuraStat for hemostasis.      Normal mucosa was found in the entire colon otherwise.      Non-bleeding non-thrombosed external and internal hemorrhoids were found       during retroflexion, during perianal exam and during digital exam. The       hemorrhoids were Grade II (internal hemorrhoids that prolapse but reduce       spontaneously). Impression:               - Perianal skin tags found on perianal exam.                           - Hemorrhoids found on digital rectal exam.                           - The examined portion of the ileum was normal.                           - One polypoid nodular recurrence noted at the                            ileocecal valve (this seems to not deeply inovlve  the TI but just the ICV at the cecal/ascending                            colon portion), removed with underwater assisted                            mucosal resection (2 pieces). Resected and                            retrieved. Treated with STSC to the margin.                            PuraStat injected.                           - Normal mucosa in the entire examined colon                            otherwise.                           - Non-bleeding non-thrombosed external  and internal                            hemorrhoids. Moderate Sedation:      Not Applicable - Patient had care per Anesthesia. Recommendation:           - The patient will be observed post-procedure,                            until all discharge criteria are met.                           - Discharge patient to home.                           - Patient has a contact number available for                            emergencies. The signs and symptoms of potential                            delayed complications were discussed with the                            patient. Return to normal activities tomorrow.                            Written discharge instructions were provided to the                            patient.                           - High fiber diet.                           -  Use FiberCon 1-2 tablets PO daily.                           - Minimize NSAIDs as able for next 2-weeks.                           - Continue present medications.                           - Await pathology results.                           - Repeat colonoscopy in 6-9 months for surveillance                            (pending pathology). We are beginning to run out of                            options to treat this endoscopically, but will see                            where things are at followup.                           - The findings and recommendations were discussed                            with the patient.                           - The findings and recommendations were discussed                            with the designated responsible adult. Procedure Code(s):        --- Professional ---                           [id number], Colonoscopy, flexible; with endoscopic                            mucosal resection Diagnosis Code(s):        --- Professional ---                           K64.1, Second degree hemorrhoids                           D13.39, Benign neoplasm of other parts of small                             intestine                           K64.4, Residual hemorrhoidal skin tags                           Z09,  Encounter for follow-up examination after                            completed treatment for conditions other than                            malignant neoplasm                           Z86.010, Personal history of colonic polyps CPT copyright 2022 American Medical Association. All rights reserved. The codes documented in this report are preliminary and upon coder review may  be revised to meet current compliance requirements. Corliss Parish, MD 03/31/2023 8:54:15 AM Number of Addenda: 0

## 2023-04-01 ENCOUNTER — Encounter: Payer: Self-pay | Admitting: Gastroenterology

## 2023-04-01 LAB — SURGICAL PATHOLOGY

## 2023-04-02 ENCOUNTER — Telehealth: Payer: Self-pay

## 2023-04-02 ENCOUNTER — Other Ambulatory Visit (HOSPITAL_COMMUNITY): Payer: Self-pay

## 2023-04-02 NOTE — Telephone Encounter (Signed)
Pharmacy Patient Advocate Encounter  Received notification from [employer] that Prior Authorization for Ozempic (1 MG/DOSE) 4MG /3ML pen-injectors has been APPROVED from 03-03-2023 to 04-01-2024. Ran test claim, Copay is $24.99. Insurance pays a MAX of 30 day supply. This test claim was processed through [facility]- copay amounts may vary at other pharmacies due to pharmacy/plan contracts, or as the patient moves through the different stages of their insurance plan.   PA #/Case ID/Reference #: [id number]

## 2023-04-02 NOTE — Telephone Encounter (Signed)
We have not started any PA's on this patient

## 2023-04-02 NOTE — Telephone Encounter (Signed)
Pharmacy Patient Advocate Encounter  Received notification from [employer] that Prior Authorization for Ozempic (1 MG/DOSE) 4MG /3ML pen-injectors has been CANCELLED due to We were unable to reach your prescriber in order to complete the coverage review process. You can provide this information to [employer] by calling [phone], 24 hours a day, 7 days a week, or by returning the fax questionnaire that we previously provided.  Fax from insurance is being attached to media. An appeal form is located within the fax if needed.

## 2023-04-02 NOTE — Telephone Encounter (Signed)
Do you know if our office has  started this PA previously for this patient?

## 2023-04-02 NOTE — Telephone Encounter (Signed)
This is not our patient.

## 2023-04-06 ENCOUNTER — Encounter (HOSPITAL_COMMUNITY): Payer: Self-pay | Admitting: Gastroenterology

## 2023-04-15 ENCOUNTER — Ambulatory Visit: Payer: BC Managed Care – PPO | Admitting: Family Medicine

## 2023-06-13 ENCOUNTER — Other Ambulatory Visit: Payer: Self-pay | Admitting: Family Medicine

## 2023-06-13 DIAGNOSIS — E1169 Type 2 diabetes mellitus with other specified complication: Secondary | ICD-10-CM

## 2023-06-16 ENCOUNTER — Ambulatory Visit (INDEPENDENT_AMBULATORY_CARE_PROVIDER_SITE_OTHER): Payer: BC Managed Care – PPO | Admitting: Family Medicine

## 2023-06-16 ENCOUNTER — Other Ambulatory Visit: Payer: Self-pay | Admitting: Family Medicine

## 2023-06-16 ENCOUNTER — Encounter: Payer: Self-pay | Admitting: Family Medicine

## 2023-06-16 ENCOUNTER — Other Ambulatory Visit: Payer: Self-pay

## 2023-06-16 VITALS — BP 126/82 | HR 95 | Temp 98.0°F | Resp 16 | Ht 73.0 in | Wt 285.4 lb

## 2023-06-16 DIAGNOSIS — I10 Essential (primary) hypertension: Secondary | ICD-10-CM | POA: Diagnosis not present

## 2023-06-16 DIAGNOSIS — F411 Generalized anxiety disorder: Secondary | ICD-10-CM | POA: Insufficient documentation

## 2023-06-16 DIAGNOSIS — G4733 Obstructive sleep apnea (adult) (pediatric): Secondary | ICD-10-CM

## 2023-06-16 DIAGNOSIS — E782 Mixed hyperlipidemia: Secondary | ICD-10-CM

## 2023-06-16 DIAGNOSIS — E669 Obesity, unspecified: Secondary | ICD-10-CM | POA: Diagnosis not present

## 2023-06-16 DIAGNOSIS — M109 Gout, unspecified: Secondary | ICD-10-CM | POA: Diagnosis not present

## 2023-06-16 DIAGNOSIS — Z7985 Long-term (current) use of injectable non-insulin antidiabetic drugs: Secondary | ICD-10-CM

## 2023-06-16 DIAGNOSIS — E1169 Type 2 diabetes mellitus with other specified complication: Secondary | ICD-10-CM | POA: Diagnosis not present

## 2023-06-16 DIAGNOSIS — Z23 Encounter for immunization: Secondary | ICD-10-CM

## 2023-06-16 DIAGNOSIS — E781 Pure hyperglyceridemia: Secondary | ICD-10-CM

## 2023-06-16 LAB — LIPID PANEL
Cholesterol: 167 mg/dL (ref 0–200)
HDL: 22.1 mg/dL — ABNORMAL LOW (ref >39.00)
Total CHOL/HDL Ratio: 8
Triglycerides: 1245 mg/dL — ABNORMAL HIGH (ref 0.0–149.0)

## 2023-06-16 LAB — COMPREHENSIVE METABOLIC PANEL
ALT: 23 U/L (ref 0–53)
AST: 37 U/L (ref 0–37)
Albumin: 4.5 g/dL (ref 3.5–5.2)
Alkaline Phosphatase: 54 U/L (ref 39–117)
BUN: 11 mg/dL (ref 6–23)
CO2: 27 meq/L (ref 19–32)
Calcium: 9.3 mg/dL (ref 8.4–10.5)
Chloride: 97 meq/L (ref 96–112)
Creatinine, Ser: 0.98 mg/dL (ref 0.40–1.50)
GFR: 89.02 mL/min (ref >60.00)
Glucose, Bld: 204 mg/dL — ABNORMAL HIGH (ref 70–99)
Potassium: 4.1 meq/L (ref 3.5–5.1)
Sodium: 136 meq/L (ref 135–145)
Total Bilirubin: 0.6 mg/dL (ref 0.2–1.2)
Total Protein: 7.1 g/dL (ref 6.0–8.3)

## 2023-06-16 LAB — MICROALBUMIN / CREATININE URINE RATIO
Creatinine,U: 103.1 mg/dL
Microalb Creat Ratio: 5.3 mg/g (ref 0.0–30.0)
Microalb, Ur: 5.4 mg/dL — ABNORMAL HIGH (ref 0.0–1.9)

## 2023-06-16 LAB — HEMOGLOBIN A1C: Hgb A1c MFr Bld: 8.6 % — ABNORMAL HIGH (ref 4.6–6.5)

## 2023-06-16 LAB — URIC ACID: Uric Acid, Serum: 5 mg/dL (ref 4.0–7.8)

## 2023-06-16 LAB — LDL CHOLESTEROL, DIRECT: Direct LDL: 31 mg/dL

## 2023-06-16 MED ORDER — FENOFIBRATE MICRONIZED 200 MG PO CAPS: 200.0000 mg | ORAL_CAPSULE | Freq: Every day | ORAL | 3 refills | Status: DC

## 2023-06-16 MED ORDER — SEMAGLUTIDE (2 MG/DOSE) 8 MG/3ML ~~LOC~~ SOPN: 2.0000 mg | PEN_INJECTOR | SUBCUTANEOUS | 3 refills | Status: DC

## 2023-06-16 MED ORDER — CITALOPRAM HYDROBROMIDE 10 MG PO TABS: 10.0000 mg | ORAL_TABLET | Freq: Every day | ORAL | 3 refills | Status: DC

## 2023-06-16 MED ORDER — SILDENAFIL CITRATE 100 MG PO TABS: 50.0000 mg | ORAL_TABLET | Freq: Every day | ORAL | 2 refills | Status: DC | PRN

## 2023-06-16 NOTE — Progress Notes (Signed)
Subjective:   Chief Complaint  Patient presents with   Follow-up    Follow up A1C    Jesse [name] is a 52 y.o. male here for follow-up of diabetes.   Jesse [name] does not routinely check his sugars.  Patient does not require insulin.   Medications include: Ozempic 1 mg/week Diet is OK.  Exercise: walking  Hypertension Patient presents for hypertension follow up. He does not monitor home blood pressures. He is compliant with medications-Zestoretic 20-12.5 mg daily. Patient has these side effects of medication: none Diet/exercise as above.  No CP or SOB.   Gout Patient has a history of gout.  No recent flares.  He is compliant with his allopurinol 300 mg daily.  He mines his diet for purine rich foods.  Hyperlipidemia Patient presents for dyslipidemia follow up. Currently being treated with Lipitor 40 mg daily, fenofibrate 200 mg daily, Vascepa 2 g twice daily and compliance with treatment thus far has been good. He denies myalgias. Diet/exercise as above.  The patient is not known to have coexisting coronary artery disease.  GAD History of anxiety currently on Wellbutrin XL 300 mg daily and Xanax as needed.  His job is quite stressful.  He has been having fatigue over the past several months.  He attributes it to possible uncontrolled sleep apnea or this.  He is not following with a therapist or counselor.  No homicidal or suicidal ideation.  No self-medication.  The patient's last study was 12 years ago.  He is compliant with this CPAP who wonders if it needs to be calibrated or for equipment needs to be updated.  He does not have a sleep specialist currently.  Past Medical History:  Diagnosis Date   Arthritis    Diabetes mellitus without complication (HCC)    on meds   Gout    Hyperlipidemia    on meds   Hypertension    on meds   Sleep apnea    uses CPAP     Related testing: Retinal exam: Done Pneumovax: done  Objective:  BP 126/82 (BP Location: Left Arm, Patient  Position: Sitting, Cuff Size: Normal)   Pulse 95   Temp 98 F (36.7 C) (Oral)   Resp 16   Ht 6\' 1"  (1.854 m)   Wt 285 lb 6.4 oz (129.5 kg)   SpO2 98%   BMI 37.65 kg/m  General:  Well developed, well nourished, in no apparent distress Skin:  Warm, no pallor or diaphoresis Head:  Normocephalic, atraumatic Eyes:  Pupils equal and round, sclera anicteric without injection  Lungs:  CTAB, no access msc use Cardio:  RRR, no bruits, no LE edema Musculoskeletal:  Symmetrical muscle groups noted without atrophy or deformity Neuro:  Sensation intact to pinprick on feet Psych: Age appropriate judgment and insight  Assessment:   Type 2 diabetes mellitus with obesity (HCC) - Plan: Comprehensive metabolic panel, Lipid panel, Hemoglobin A1c, Microalbumin / creatinine urine ratio  Essential hypertension  Gouty arthropathy - Plan: Uric acid  Mixed hyperlipidemia  OSA on CPAP - Plan: Ambulatory referral to Neurology  Need for influenza vaccination - Plan: Flu vaccine trivalent PF, 6mos and older(Flulaval,Afluria,Fluarix,Fluzone)   Plan:   Chronic, hopefully stable.  Continue Ozempic 1 mg weekly.  Counseled on diet and exercise. Chronic, stable.  Continue Zestoretic 20-12.5 mg daily. Chronic, stable.  Continue allopurinol 300 mg daily. Chronic, stable.  Continue Vascepa 2 g twice daily, Lipitor 40 mg daily, fenofibrate 200 mg daily. Refer back to the sleep team.  Chronic, unstable.  Continue Wellbutrin 300 mg daily, Xanax as needed, add Celexa 10 mg daily.  Counseling emergency room provided.  Follow-up in 6 weeks to recheck this. Flu shot today. The patient voiced understanding and agreement to the plan.  Jilda Roche [place], DO 06/16/23 9:48 AM

## 2023-06-16 NOTE — Patient Instructions (Addendum)
If you do not hear anything about your referral in the next 1-2 weeks, call our office and ask for an update.  Give [place] 2-3 business days to get the results of your labs back.   Keep the diet clean and stay active.  Take Metamucil or Benefiber daily.  Use GoodRx for the sildenafil.   Please consider counseling. Contact [phone] to schedule an appointment or inquire about cost/insurance coverage.  [facility] located at [address], [place], [place].  Phone number = [phone].  Dr. Regan Lemming - Adult Psychiatry.    [facility] located at [address] [place], [place], [place]. Phone number = [phone].   [facility] located at [address], [place], [place].  Phone number = [phone].   [facility] located at [address] [place], [place], [place].  Phone number = [phone].  Let us know if you need anything.  Biceps Tendon Disruption (Proximal) Rehab Do exercises exactly as told by your health care provider and adjust them as directed. It is normal to feel mild stretching, pulling, tightness, or discomfort as you do these exercises, but you should stop right away if you feel sudden pain or your pain gets worse.  Stretching and range of motion exercises These exercises warm up your muscles and joints and improve the movement and flexibility of your arm and shoulder. These exercises also help to relieve pain and stiffness. Exercise A: Shoulder flexion, standing   Stand facing a wall. Put your left / right hand on the wall. Slide your left / right hand up the wall. Stop when you feel a stretch in your shoulder, or when you reach the angle recommended by your health care provider. Use your other hand to help raise your arm, if needed. As your hand gets higher, you may need to step closer to the wall. Avoid shrugging your shoulder while you raise your arm. To do this, keep your shoulder blade tucked  down toward your spine. Hold for 30 seconds. Slowly return to the starting position. Use your other arm to help, if needed. Repeat 2 times. Complete this exercise 3 times per week. Exercise B: Pendulum   Stand near a wall or a surface that you can hold onto for balance. Bend at the waist and let your left / right arm hang straight down. Use your other arm to support you. Relax your arm and shoulder muscles, and move your hips and your trunk so your left / right arm swings freely. Your arm should swing because of the motion of your body, not because you are using your arm or shoulder muscles. Keep moving so your arm swings in the following directions, as told by your health care provider: Side to side. Forward and backward. In clockwise and counterclockwise circles. Slowly return to the starting position. Repeat 2 times. Complete this exercise 3 times per week.  Strengthening exercises These exercises build strength and endurance in your arm and shoulder. Endurance is the ability to use your muscles for a long time, even after your muscles get tired. Exercise C: Elbow flexion, neutral  Sit on a stable chair without armrests, or stand. Hold a 3-5 lb weight in your left / right hand, or hold an exercise band with both hands. Your palms should face each other at the starting position. Bend your left / right elbow and move your hand up toward your shoulder. Lead with your thumb, and keep your palm facing the same  direction. Keep your other arm straight down, in the starting position. Slowly return to the starting position. Repeat 2-3 times. Complete this exercise 3 times per week. Exercise D: Forearm supination   Sit with your left / right forearm on a table. Your elbow should be below shoulder height. Rest your hand over the edge of the table so your palm faces down. If directed, hold a hammer with your left / right hand. Without moving your elbow, slowly rotate your hand so your palm faces  up toward the ceiling. If you are holding a hammer, begin by holding the hammer near the head. When this exercise gets easier for you, hold the hammer farther down the handle. Hold for 3 seconds. Slowly return to the starting position. Repeat 2 times. Complete this exercise 3 times per week. Exercise E: Scapular retraction   Sit in a stable chair without armrests, or stand. Secure an exercise band to a stable object in front of you so the band is at shoulder height. Hold one end of the exercise band in each hand. Squeeze your shoulder blades together and move your elbows slightly behind you. Do not shrug your shoulders. Hold for 3 seconds. Slowly return to the starting position. Repeat 2 times. Complete this exercise 3 times per week. Exercise F: Scapular protraction, supine   Lie on your back on a firm surface. Hold a 3-5 lb weight in your left / right hand. Raise your left / right arm straight into the air so your hand is directly above your shoulder joint. Push the weight into the air so your shoulder lifts off of the surface that you are lying on. Do not move your head, neck, or back. Hold for 3 seconds. Slowly return to the starting position. Let your muscles relax completely before you repeat this exercise. Repeat 2 times. Complete this exercise 3 times per week. This information is not intended to replace advice given to you by your health care provider. Make sure you discuss any questions you have with your health care provider. Document Released: 07/08/2005 Document Revised: 03/14/2016 Document Reviewed: 06/16/2015 Elsevier Interactive Patient Education  2017 [employer].

## 2023-06-18 ENCOUNTER — Other Ambulatory Visit: Payer: Self-pay

## 2023-06-18 MED ORDER — VITAMIN D (ERGOCALCIFEROL) 1.25 MG (50000 UNIT) PO CAPS: ORAL_CAPSULE | ORAL | 1 refills | Status: DC

## 2023-07-21 ENCOUNTER — Encounter: Payer: Self-pay | Admitting: Family Medicine

## 2023-07-24 ENCOUNTER — Institutional Professional Consult (permissible substitution): Payer: Self-pay | Admitting: Neurology

## 2023-07-29 ENCOUNTER — Encounter: Payer: Self-pay | Admitting: Family Medicine

## 2023-07-29 ENCOUNTER — Ambulatory Visit (INDEPENDENT_AMBULATORY_CARE_PROVIDER_SITE_OTHER): Payer: BC Managed Care – PPO | Admitting: Family Medicine

## 2023-07-29 VITALS — BP 118/76 | HR 95 | Temp 98.0°F | Resp 16 | Ht 73.0 in | Wt 281.6 lb

## 2023-07-29 DIAGNOSIS — E669 Obesity, unspecified: Secondary | ICD-10-CM

## 2023-07-29 DIAGNOSIS — F411 Generalized anxiety disorder: Secondary | ICD-10-CM

## 2023-07-29 DIAGNOSIS — E1169 Type 2 diabetes mellitus with other specified complication: Secondary | ICD-10-CM | POA: Diagnosis not present

## 2023-07-29 MED ORDER — DAPAGLIFLOZIN PROPANEDIOL 10 MG PO TABS: 10.0000 mg | ORAL_TABLET | Freq: Every day | ORAL | 2 refills | Status: DC

## 2023-07-29 NOTE — Patient Instructions (Addendum)
 Let me know if there are cost issues with the new medication.   Aim to do some physical exertion for 150 minutes per week. This is typically divided into 5 days per week, 30 minutes per day. The activity should be enough to get your heart rate up. Anything is better than nothing if you have time constraints.  Keep checking your sugars at home.   Send me a message in a few weeks with how you are doing with the Celexa  and the mood.   Let us  know if you need anything.

## 2023-07-29 NOTE — Progress Notes (Signed)
 Chief Complaint  Patient presents with   Follow-up    Follow up    Subjective Jesse [name] presents for f/u anxiety/depression.  Pt is currently being treated with Wellbutrin  XL 300 mg/d, Celexa  10 mg/d, Xanax  prn.  Reports improvement since treatment, but he did have some time away from work which could have improved his mood. No thoughts of harming self or others. No self-medication with alcohol, prescription drugs or illicit drugs. Pt is not following with a counselor/psychologist.  DM- A1c was 8.6 at last visit. Has been checking qid. AM readings in 160's. Taking Ozempic  2 mg/week. Compliant, no AE's. Lost 4 lbs since last visit. Diet is healthier. No exercise. No Cp or SOB. Did not tolerate metformin 2/2 GI effects.   Past Medical History:  Diagnosis Date   Arthritis    Diabetes mellitus without complication (HCC)    on meds   Gout    Hyperlipidemia    on meds   Hypertension    on meds   Sleep apnea    uses CPAP   Allergies as of 07/29/2023       Reactions   Metformin And Related Diarrhea        Medication List        Accurate as of July 29, 2023 11:08 AM. If you have any questions, ask your nurse or doctor.          acyclovir  400 MG tablet Commonly known as: ZOVIRAX  TAKE 1 TABLET TWICE A DAY   allopurinol  300 MG tablet Commonly known as: ZYLOPRIM  Take 1 tablet (300 mg total) by mouth daily.   ALPRAZolam  0.5 MG tablet Commonly known as: XANAX  TAKE 1 TABLET BY MOUTH AT BEDTIME AS NEEDED FOR ANXIETY   atorvastatin  40 MG tablet Commonly known as: LIPITOR Take 1 tablet (40 mg total) by mouth daily.   buPROPion  300 MG 24 hr tablet Commonly known as: WELLBUTRIN  XL TAKE 1 TABLET DAILY   citalopram  10 MG tablet Commonly known as: CELEXA  Take 1 tablet (10 mg total) by mouth daily.   dapagliflozin  propanediol 10 MG Tabs tablet Commonly known as: Farxiga  Take 1 tablet (10 mg total) by mouth daily before breakfast. Started by: Mabel Deward Pry   fenofibrate  micronized 200 MG capsule Commonly known as: LOFIBRA Take 1 capsule (200 mg total) by mouth daily before breakfast.   icosapent  Ethyl 1 g capsule Commonly known as: Vascepa  Take 2 capsules (2 g total) by mouth 2 (two) times daily.   lisinopril -hydrochlorothiazide  20-12.5 MG tablet Commonly known as: ZESTORETIC  TAKE 1 TABLET DAILY   Semaglutide  (2 MG/DOSE) 8 MG/3ML Sopn Inject 2 mg as directed once a week.   sildenafil  100 MG tablet Commonly known as: Viagra  Take 0.5-1 tablets (50-100 mg total) by mouth daily as needed for erectile dysfunction.   Vitamin D  (Ergocalciferol ) 1.25 MG (50000 UNIT) Caps capsule Commonly known as: DRISDOL  TAKE 1 CAPSULE EVERY 7 DAYS        Exam BP 118/76   Pulse 95   Temp 98 F (36.7 C) (Oral)   Resp 16   Ht 6' 1 (1.854 m)   Wt 281 lb 9.6 oz (127.7 kg)   SpO2 100%   BMI 37.15 kg/m  General:  well developed, well nourished, in no apparent distress Heart: RRR, no lower extremity edema, no bruits Lungs: CTAB.  No respiratory distress Psych: well oriented with normal range of affect and age-appropriate judgement/insight, alert and oriented x4.  Assessment and Plan  GAD (generalized anxiety  disorder)  Type 2 diabetes mellitus with obesity (HCC) - Plan: dapagliflozin  propanediol (FARXIGA ) 10 MG TABS tablet  Chronic, unsure if controlled.  As he gets back into his regular work schedule, he will keep me appraised of how he is doing.  For now, continue Celexa  10 mg daily, Wellbutrin  XL 3 mg daily, Xanax  as needed. Chronic, unstable.  Sugars are improving as well as his diet.  Continue Ozempic  2 mg weekly.  Add Farxiga  10 mg daily.  Monitor blood sugars at home.  Follow-up in 2 months. The patient voiced understanding and agreement to the plan.  Mabel [place], DO 07/29/23 11:08 AM

## 2023-07-30 ENCOUNTER — Encounter: Payer: Self-pay | Admitting: Neurology

## 2023-07-30 ENCOUNTER — Ambulatory Visit: Payer: BC Managed Care – PPO | Admitting: Neurology

## 2023-07-30 VITALS — BP 128/73 | HR 78 | Ht 72.0 in | Wt 282.0 lb

## 2023-07-30 DIAGNOSIS — E66812 Obesity, class 2: Secondary | ICD-10-CM | POA: Diagnosis not present

## 2023-07-30 DIAGNOSIS — E1169 Type 2 diabetes mellitus with other specified complication: Secondary | ICD-10-CM | POA: Diagnosis not present

## 2023-07-30 DIAGNOSIS — R0683 Snoring: Secondary | ICD-10-CM

## 2023-07-30 DIAGNOSIS — Z7985 Long-term (current) use of injectable non-insulin antidiabetic drugs: Secondary | ICD-10-CM

## 2023-07-30 DIAGNOSIS — G4733 Obstructive sleep apnea (adult) (pediatric): Secondary | ICD-10-CM | POA: Diagnosis not present

## 2023-07-30 DIAGNOSIS — E669 Obesity, unspecified: Secondary | ICD-10-CM

## 2023-07-30 DIAGNOSIS — Z6839 Body mass index (BMI) 39.0-39.9, adult: Secondary | ICD-10-CM

## 2023-07-30 DIAGNOSIS — E6609 Other obesity due to excess calories: Secondary | ICD-10-CM

## 2023-07-30 NOTE — Patient Instructions (Signed)
 The pressure at the 95th percentile is actually maxed out he is using 10 cm water pressure for the whole night and that is exactly where he is capped by his current settings.     I will order a new auto-set machine for him after HST with baseline, this can be a watch pat  test.    He is currently in need of extra pressure , I will reset his air sense 10 to 13  cm water .         I plan to follow up either personally or through our NP within 3-4 months.

## 2023-07-30 NOTE — Progress Notes (Signed)
 SLEEP MEDICINE CLINIC    Provider:  Dedra Gores, MD  Primary Care Physician:  Frann Mabel Mt, DO [address] [place] [address]     Referring Provider: Frann Mabel Mt [place] [address] [place],  [place] [address]          Chief Complaint according to patient   Patient presents with:                HISTORY OF PRESENT ILLNESS:  Jesse [name] is a 53 y.o. male patient who is seen upon referral on 07/30/2023 from PCP for TOC of CPAP needs, has been on CPAP for over a decade, 25 years, and current machine is at least 53 years old.  No retesting done, last sleep study was a full PSG in [facility], in a sleep lab Dr Daralyn. Chief concern according to patient :   I feel much more fatigued in AM, I snore through the machine and mask, and now wanted to get a new machine.    I have the pleasure of seeing Jesse [name] 07/30/23 a right-handed male with a known OSA sleep disorder.      Sleep relevant medical history: Nocturia/ 1 time, no  Sleep walking, no ENT surgery,  no v braces, no retainers,  no goiter. ,    Family medical /sleep history: Father and brother  on CPAP with OSA, father with CAD and DM,    Social history:  Patient is working as a tax adviser, office based,   and lives in a household with GF,  no children at home, one daughter in college in [place] .  -  lives with 2 dogs.  Tobacco use; none .  ETOH use ; very rarely, Caffeine intake in form of Coffee( /) Soda( /) Tea ( iced tea, 4  day ) or energy drinks Exercise in form of -no routine. .      Sleep habits are as follows: The patient's dinner time is between 6.30-7 PM. The patient goes to bed at 11 PM and no trouble  to go to sleep, continues to sleep for 7-8 hours, wakes for one bathroom breaks, sometimes has trouble to go back to sleep. Bedroom, quiet , mostly dark, cool,  The preferred sleep position is supine or right , with the support of 1 pillow, no  adjusted .  Dreams are reportedly frequent.   The patient wakes up at 7 AM with an alarm. 7.15  AM is the usual rise time. He feels  its a struggle to get up.  He reports not feeling refreshed or restored in AM, with  residual fatigue.  Naps are avoided- less refreshing than nocturnal sleep.    Review of Systems: Out of a complete 14 system review, the patient complains of only the following symptoms, and all other reviewed systems are negative.:  Fatigue, sleepiness , snoring, fragmented sleep when under stress, RLS.    How likely are you to doze in the following situations: 0 = not likely, 1 = slight chance, 2 = moderate chance, 3 = high chance   Sitting and Reading? Watching Television? Sitting inactive in a public place (theater or meeting)? As a passenger in a car for an hour without a break? Lying down in the afternoon when circumstances permit? Sitting and talking to someone? Sitting quietly after lunch without alcohol? In a car, while stopped for a few minutes in traffic?   Total =  8/ 24 points   FSS endorsed at 47/ 63 points.   Social History   Socioeconomic History   Marital status: Single    Spouse name: Not on file   Number of children: Not on file   Years of education: Not on file   Highest education level: Master's degree (e.g., MA, MS, MEng, MEd, MSW, MBA)  Occupational History   Not on file  Tobacco Use   Smoking status: Never   Smokeless tobacco: Never  Vaping Use   Vaping status: Never Used  Substance and Sexual Activity   Alcohol use: Yes    Alcohol/week: 3.0 standard drinks of alcohol    Types: 3 Standard drinks or equivalent per week   Drug use: Never   Sexual activity: Not on file  Other Topics Concern   Not on file  Social History Narrative   Not on file   Social Drivers of Health   Financial Resource Strain: Low Risk  (06/15/2023)   Overall Financial Resource Strain (CARDIA)    Difficulty of Paying Living Expenses: Not hard at all  Food  Insecurity: No Food Insecurity (06/15/2023)   Hunger Vital Sign    Worried About Running Out of Food in the Last Year: Never true    Ran Out of Food in the Last Year: Never true  Transportation Needs: No Transportation Needs (06/15/2023)   PRAPARE - Administrator, Civil Service (Medical): No    Lack of Transportation (Non-Medical): No  Physical Activity: Unknown (06/15/2023)   Exercise Vital Sign    Days of Exercise per Week: 0 days    Minutes of Exercise per Session: Not on file  Stress: Stress Concern Present (06/15/2023)   [employer] of Occupational Health - Occupational Stress Questionnaire    Feeling of Stress : To some extent  Social Connections: Unknown (06/15/2023)   Social Connection and Isolation Panel [NHANES]    Frequency of Communication with Friends and Family: More than three times a week    Frequency of Social Gatherings with Friends and Family: More than three times a week    Attends Religious Services: Patient declined    Database Administrator or Organizations: No    Attends Engineer, Structural: Not on file    Marital Status: Living with partner    Family History  Problem Relation Age of Onset   Heart disease Father    Colon polyps Sister 2   Colon polyps Brother 50   Heart disease Paternal Grandmother    Heart disease Paternal Grandfather    Colon cancer Neg Hx    Esophageal cancer Neg Hx    Rectal cancer Neg Hx    Stomach cancer Neg Hx    Inflammatory bowel disease Neg Hx    Liver disease Neg Hx    Pancreatic cancer Neg Hx     Past Medical History:  Diagnosis Date   Arthritis    Diabetes mellitus without complication (HCC)    on meds   Gout    Hyperlipidemia    on meds   Hypertension    on meds   Sleep apnea    uses CPAP    Past Surgical History:  Procedure Laterality Date   BICEPS TENDON REPAIR Left    COLONOSCOPY WITH PROPOFOL  N/A 01/01/2022   Procedure: COLONOSCOPY WITH PROPOFOL ;  Surgeon: Mansouraty,  Aloha Raddle., MD;  Location: [facility];  Service: Gastroenterology;  Laterality: N/A;   COLONOSCOPY WITH PROPOFOL  N/A 06/24/2022   Procedure:  COLONOSCOPY WITH PROPOFOL ;  Surgeon: Mansouraty, Aloha Raddle., MD;  Location: [facility];  Service: Gastroenterology;  Laterality: N/A;   COLONOSCOPY WITH PROPOFOL  N/A 03/31/2023   Procedure: COLONOSCOPY WITH PROPOFOL ;  Surgeon: Mansouraty, Aloha Raddle., MD;  Location: [facility];  Service: Gastroenterology;  Laterality: N/A;   ENDOSCOPIC MUCOSAL RESECTION N/A 01/01/2022   Procedure: ENDOSCOPIC MUCOSAL RESECTION;  Surgeon: Wilhelmenia Aloha Raddle., MD;  Location: [facility];  Service: Gastroenterology;  Laterality: N/A;  ICV polyp   ENDOSCOPIC MUCOSAL RESECTION N/A 06/24/2022   Procedure: ENDOSCOPIC MUCOSAL RESECTION;  Surgeon: Wilhelmenia Aloha Raddle., MD;  Location: [facility];  Service: Gastroenterology;  Laterality: N/A;   ENDOSCOPIC MUCOSAL RESECTION N/A 03/31/2023   Procedure: ENDOSCOPIC MUCOSAL RESECTION;  Surgeon: Wilhelmenia Aloha Raddle., MD;  Location: [facility];  Service: Gastroenterology;  Laterality: N/A;   HEMOSTASIS CLIP PLACEMENT  06/24/2022   Procedure: HEMOSTASIS CLIP PLACEMENT;  Surgeon: Wilhelmenia Aloha Raddle., MD;  Location: [facility];  Service: Gastroenterology;;   HEMOSTASIS CONTROL  03/31/2023   Procedure: HEMOSTASIS CONTROL;  Surgeon: Wilhelmenia Aloha Raddle., MD;  Location: [facility];  Service: Gastroenterology;;   HERNIA REPAIR  2019   HIP SURGERY Right 01/2022   HOT HEMOSTASIS N/A 01/01/2022   Procedure: HOT HEMOSTASIS (ARGON PLASMA COAGULATION/BICAP);  Surgeon: Wilhelmenia Aloha Raddle., MD;  Location: [facility];  Service: Gastroenterology;  Laterality: N/A;  ICV EMR   KNEE SURGERY Left 2018   POLYPECTOMY  01/01/2022   Procedure: POLYPECTOMY;  Surgeon: Mansouraty, Aloha Raddle., MD;  Location: [facility];  Service: Gastroenterology;;  cecal   POLYPECTOMY  06/24/2022   Procedure: POLYPECTOMY;  Surgeon: Wilhelmenia Aloha Raddle., MD;  Location: [facility];  Service: Gastroenterology;;   SUBMUCOSAL LIFTING INJECTION  01/01/2022   Procedure: SUBMUCOSAL LIFTING INJECTION;  Surgeon: Wilhelmenia Aloha Raddle., MD;  Location: [facility];  Service: Gastroenterology;;  ICV EMR   SUBMUCOSAL LIFTING INJECTION  06/24/2022   Procedure: SUBMUCOSAL LIFTING INJECTION;  Surgeon: Wilhelmenia Aloha Raddle., MD;  Location: [facility];  Service: Gastroenterology;;     Current Outpatient Medications on File Prior to Visit  Medication Sig Dispense Refill   acyclovir  (ZOVIRAX ) 400 MG tablet TAKE 1 TABLET TWICE A DAY 180 tablet 3   allopurinol  (ZYLOPRIM ) 300 MG tablet Take 1 tablet (300 mg total) by mouth daily. 90 tablet 3   ALPRAZolam  (XANAX ) 0.5 MG tablet TAKE 1 TABLET BY MOUTH AT BEDTIME AS NEEDED FOR ANXIETY 30 tablet 3   atorvastatin  (LIPITOR) 40 MG tablet Take 1 tablet (40 mg total) by mouth daily. 90 tablet 3   buPROPion  (WELLBUTRIN  XL) 300 MG 24 hr tablet TAKE 1 TABLET DAILY 90 tablet 3   citalopram  (CELEXA ) 10 MG tablet Take 1 tablet (10 mg total) by mouth daily. 30 tablet 3   dapagliflozin  propanediol (FARXIGA ) 10 MG TABS tablet Take 1 tablet (10 mg total) by mouth daily before breakfast. 30 tablet 2   fenofibrate  micronized (LOFIBRA) 200 MG capsule Take 1 capsule (200 mg total) by mouth daily before breakfast. 90 capsule 3   icosapent  Ethyl (VASCEPA ) 1 g capsule Take 2 capsules (2 g total) by mouth 2 (two) times daily. 360 capsule 1   lisinopril -hydrochlorothiazide  (ZESTORETIC ) 20-12.5 MG tablet TAKE 1 TABLET DAILY 90 tablet 3   Semaglutide , 2 MG/DOSE, 8 MG/3ML SOPN Inject 2 mg as directed once a week. 3 mL 3   sildenafil  (VIAGRA ) 100 MG tablet Take 0.5-1 tablets (50-100 mg total) by mouth daily as needed for erectile dysfunction. 30 tablet 2   Vitamin D , Ergocalciferol , (DRISDOL )  1.25 MG (50000 UNIT) CAPS capsule TAKE 1 CAPSULE EVERY 7 DAYS 12 capsule 1   No current facility-administered medications on file prior to visit.     Allergies  Allergen Reactions   Metformin And Related Diarrhea     DIAGNOSTIC DATA (LABS, IMAGING, TESTING) - I reviewed patient records, labs, notes, testing and imaging myself where available.  Lab Results  Component Value Date   WBC 5.0 04/04/2022   HGB 13.0 04/04/2022   HCT 38.4 (L) 04/04/2022   MCV 88.3 04/04/2022   PLT 213.0 04/04/2022      Component Value Date/Time   NA 136 06/16/2023 1008   K 4.1 06/16/2023 1008   CL 97 06/16/2023 1008   CO2 27 06/16/2023 1008   GLUCOSE 204 (H) 06/16/2023 1008   BUN 11 06/16/2023 1008   CREATININE 0.98 06/16/2023 1008   CALCIUM  9.3 06/16/2023 1008   PROT 7.1 06/16/2023 1008   ALBUMIN 4.5 06/16/2023 1008   AST 37 06/16/2023 1008   ALT 23 06/16/2023 1008   ALKPHOS 54 06/16/2023 1008   BILITOT 0.6 06/16/2023 1008   Lab Results  Component Value Date   CHOL 167 06/16/2023   HDL 22.10 (L) 06/16/2023   LDLDIRECT 31.0 06/16/2023   TRIG (H) 06/16/2023    1245.0 Triglyceride is over 400; calculations on Lipids are invalid.   CHOLHDL 8 06/16/2023   Lab Results  Component Value Date   HGBA1C 8.6 (H) 06/16/2023   No results found for: [id number] Lab Results  Component Value Date   TSH 2.00 01/13/2023    PHYSICAL EXAM:  Today's Vitals   07/30/23 1552  BP: 128/73  Pulse: 78  Weight: 282 lb (127.9 kg)  Height: 6' (1.829 m)   Body mass index is 38.25 kg/m.   Wt Readings from Last 3 Encounters:  07/30/23 282 lb (127.9 kg)  07/29/23 281 lb 9.6 oz (127.7 kg)  06/16/23 285 lb 6.4 oz (129.5 kg)     Ht Readings from Last 3 Encounters:  07/30/23 6' (1.829 m)  07/29/23 6' 1 (1.854 m)  06/16/23 6' 1 (1.854 m)      General: The patient is awake, alert and appears not in acute distress. The patient is well groomed. Head: Normocephalic, atraumatic. Neck is supple.  Mallampati 3 plus ,  neck circumference:19.5 inches . Nasal airflow fully patent.  Retrognathia is nor seen.  Dental status: biological Cardiovascular:   Regular rate and cardiac rhythm by pulse,  without distended neck veins. Respiratory: Lungs are clear to auscultation.  Skin:  Without evidence of ankle edema, or rash. Trunk: The patient's posture is erect.   NEUROLOGIC EXAM: The patient is awake and alert, oriented to place and time.   Memory subjective described as intact.  Attention span & concentration ability appears normal.  Speech is fluent,  without  dysarthria, dysphonia or aphasia.  Mood and affect are appropriate.   Cranial nerves: no loss of smell or taste reported  Pupils are equal and briskly reactive to light. Funduscopic exam deferred..  Extraocular movements in vertical and horizontal planes were intact and without nystagmus. No Diplopia. Visual fields by finger perimetry are intact. Hearing was intact to soft voice and finger rubbing.    Facial sensation intact to fine touch.  Facial motor strength is symmetric and tongue and uvula move midline.  Neck ROM : rotation, tilt and flexion extension were normal for age and shoulder shrug was symmetrical.    Motor exam:  Symmetric bulk, tone and ROM.  Normal tone without cog-wheeling, symmetric grip strength .   Sensory:  Fine touch vibration were  normal.  Proprioception tested in the upper extremities was normal.   Coordination: Rapid alternating movements in the fingers/hands were of normal speed.  The Finger-to-nose maneuver was intact without evidence of ataxia or tremor.   Gait and station: Patient could rise unassisted from a seated position, walked without assistive device.  Stance is of normal width/ base .  Toe and heel walk were deferred.  Deep tendon reflexes: in the  upper and lower extremities are symmetric and intact.  Babinski response was deferred .    ASSESSMENT AND PLAN 53 y.o. year old male  here with:  I have the pleasure of meeting Jesse [name] today he is a 53 year old experienced CPAP user who has carried a diagnosis of obstructive  sleep apnea for over 2 decades.  His current machine is about a decade old and he was last evaluated for obstructive sleep apnea by an in laboratory polysomnography over a decade ago in [facility] [place] .  He has always responded well to CPAP but recently he feels more fatigued again, sleep has been less restorative less refreshing than it used to be he endorsed the Epworth sleepiness scale today at 8 out of 24 points and the fatigue severity score at 47 out of 63 points,  He is a highly compliant CPAP user with 100% compliance by hours and by days.  The average use of time is 7 hours 45 minutes.  He is currently an air sense 10 auto titration device by [employer] with a minimum pressure setting of 5 cm water and a maximum of 10 with expiratory relief of 3 cm water.  The residual AHI is 0.7 which is very low index.  The 95th percentile air leak is low at 6.6 L this speaks for good fitting of the mask but he does report that he snores through the mask.  The pressure at the 95th percentile is actually maxed out he is using 10 cm water pressure for the whole night and that is exactly where he is capped by his current settings.   I will order a new auto-set machine for him after HST with baseline, this can be a watch pat  test.   He is currently in need of extra pressure , I will reset his air sense 10 to 13  cm water .     I plan to follow up either personally or through our NP within 3-4 months.   I would like to thank Frann Mabel Mt, DO and Frann Mabel Mt, Do [address] [place],  [place] [address] for allowing me to meet with and to take care of this pleasant patient.    After spending a total time of  45  minutes face to face and additional time for physical and neurologic examination, review of laboratory studies,  personal review of imaging studies, reports and results of other testing and review of referral information / records as far as provided in  visit,   Electronically signed by: Dedra Gores, MD 07/30/2023 3:56 PM  [facility] Sleep Board certified by The [employer] of Sleep Medicine and Diplomate of the [employer] of Sleep Medicine. Board certified In Neurology through the ABPN, Fellow of the [employer] of Neurology.

## 2023-08-05 ENCOUNTER — Encounter: Payer: Self-pay | Admitting: Family Medicine

## 2023-08-05 ENCOUNTER — Encounter: Payer: Self-pay | Admitting: Cardiology

## 2023-08-05 ENCOUNTER — Ambulatory Visit: Payer: BC Managed Care – PPO | Attending: Cardiology | Admitting: Cardiology

## 2023-08-05 VITALS — BP 138/80 | HR 81 | Ht 72.0 in | Wt 281.0 lb

## 2023-08-05 DIAGNOSIS — I10 Essential (primary) hypertension: Secondary | ICD-10-CM

## 2023-08-05 DIAGNOSIS — G4733 Obstructive sleep apnea (adult) (pediatric): Secondary | ICD-10-CM | POA: Diagnosis not present

## 2023-08-05 DIAGNOSIS — R0789 Other chest pain: Secondary | ICD-10-CM

## 2023-08-05 DIAGNOSIS — E782 Mixed hyperlipidemia: Secondary | ICD-10-CM

## 2023-08-05 DIAGNOSIS — E669 Obesity, unspecified: Secondary | ICD-10-CM

## 2023-08-05 DIAGNOSIS — E1169 Type 2 diabetes mellitus with other specified complication: Secondary | ICD-10-CM | POA: Diagnosis not present

## 2023-08-05 DIAGNOSIS — R0609 Other forms of dyspnea: Secondary | ICD-10-CM

## 2023-08-05 DIAGNOSIS — R072 Precordial pain: Secondary | ICD-10-CM

## 2023-08-05 DIAGNOSIS — E119 Type 2 diabetes mellitus without complications: Secondary | ICD-10-CM

## 2023-08-05 MED ORDER — METOPROLOL TARTRATE 100 MG PO TABS: ORAL_TABLET | ORAL | 0 refills | Status: DC

## 2023-08-05 MED ORDER — SEMAGLUTIDE (2 MG/DOSE) 8 MG/3ML ~~LOC~~ SOPN: 2.0000 mg | PEN_INJECTOR | SUBCUTANEOUS | 0 refills | Status: DC

## 2023-08-05 NOTE — Patient Instructions (Signed)
 Medication Instructions:   TAKE: Metoprolol  100mg  1 tablet 2 hours prior to CT Scan  HOLD: Lisinopril /hydrochlorothiazide  - morning of the CT Scan   Lab Work: 3rd Floor  [address] BMP- today If you have labs (blood work) drawn today and your tests are completely normal, you will receive your results only by: MyChart Message (if you have MyChart) OR A paper copy in the mail If you have any lab test that is abnormal or we need to change your treatment, we will call you to review the results.   Testing/Procedures: Your physician has requested that you have an echocardiogram. Echocardiography is a painless test that uses sound waves to create images of your heart. It provides your doctor with information about the size and shape of your heart and how well your heart's chambers and valves are working. This procedure takes approximately one hour. There are no restrictions for this procedure. Please do NOT wear cologne, perfume, aftershave, or lotions (deodorant is allowed). Please arrive 15 minutes prior to your appointment time.  Please note: We ask at that you not bring children with you during ultrasound (echo/ vascular) testing. Due to room size and safety concerns, children are not allowed in the ultrasound rooms during exams. Our front office staff cannot provide observation of children in our lobby area while testing is being conducted. An adult accompanying a patient to their appointment will only be allowed in the ultrasound room at the discretion of the ultrasound technician under special circumstances. We apologize for any inconvenience.    Your cardiac CT will be scheduled at one of the below locations:   [facility] [address] [place], [place] [address]     Please follow these instructions carefully (unless otherwise directed):  Hold all erectile dysfunction medications at least 3 days (72 hrs) prior to test.  On the Night Before the Test: Be sure to Drink  plenty of water. Do not consume any caffeinated/decaffeinated beverages or chocolate 12 hours prior to your test. Do not take any antihistamines 12 hours prior to your test.   On the Day of the Test: Drink plenty of water until 1 hour prior to the test. Do not eat any food 4 hours prior to the test. You may take your regular medications prior to the test.  Take metoprolol  (Lopressor ) two hours prior to test. HOLD Furosemide/Hydrochlorothiazide  morning of the test.        After the Test: Drink plenty of water. After receiving IV contrast, you may experience a mild flushed feeling. This is normal. On occasion, you may experience a mild rash up to 24 hours after the test. This is not dangerous. If this occurs, you can take Benadryl 25 mg and increase your fluid intake. If you experience trouble breathing, this can be serious. If it is severe call 911 IMMEDIATELY. If it is mild, please call our office. If you take any of these medications: Glipizide/Metformin, Avandament, Glucavance, please do not take 48 hours after completing test unless otherwise instructed.  We will call to schedule your test 2-4 weeks out understanding that some insurance companies will need an authorization prior to the service being performed.   For non-scheduling related questions, please contact the cardiac imaging nurse navigator should you have any questions/concerns: Camie Shutter, Cardiac Imaging Nurse Navigator Chantal Requena, Cardiac Imaging Nurse Navigator [facility] Direct Office Dial: [phone]   For scheduling needs, including cancellations and rescheduling, please call Brittany, [phone].  Follow-Up: At [facility], you and your health needs are our priority.  As part of our continuing mission to provide you with exceptional heart care, we have created designated Provider Care Teams.  These Care Teams include your primary Cardiologist (physician) and Advanced  Practice Providers (APPs -  Physician Assistants and Nurse Practitioners) who all work together to provide you with the care you need, when you need it.  We recommend signing up for the patient portal called MyChart.  Sign up information is provided on this After Visit Summary.  MyChart is used to connect with patients for Virtual Visits (Telemedicine).  Patients are able to view lab/test results, encounter notes, upcoming appointments, etc.  Non-urgent messages can be sent to your provider as well.   To learn more about what you can do with MyChart, go to [email or web address].    Your next appointment:   3 month(s)  The format for your next appointment:   In Person  Provider:   Lamar Fitch, MD    Other Instructions NA

## 2023-08-05 NOTE — Progress Notes (Signed)
 Cardiology Consultation:    Date:  08/05/2023   ID:  Jesse [name], DOB 1971-03-07, MRN [id number]  PCP:  Frann Mabel Mt, DO  Cardiologist:  Lamar Fitch, MD   Referring MD: Frann Mabel Mt*   Chief Complaint  Patient presents with   Hyperlipidemia    History of Present Illness:    Jesse [name] is a 53 y.o. male who is being seen today for the evaluation of hypertriglyceridemia, risk assessment at the request of Frann Mabel Mt*.  Past medical history significant for diabetes mellitus, he had borderline diabetes for many years but finally developed full diabetes.  Now relatively poorly controlled.  Also history of hyperlipidemia with hypertriglyceridemia.  Obstructive sleep apnea, essential hypertension.  He never smoked.  He does have family history of premature coronary artery disease multiple family members and up having problems early including his father was driving problems patient 9 at that time his father was nondiabetic he did not smoke not much risk factors but still he end up having a [name] of problems.  He was referred to us  because of high triglycerides as well as the fact that he is very weak tired exhausted he wakes up exhausted when he tried to walk climb stair he did exhausted very easily.  He denies having typical chest pain tightness squeezing pressure burning chest just exertion.  I am more about potentially having angina equivalent.  He is LDL is well-controlled 37 last number however his triglycerides are more than 300 now.  He always struggled with triglycerides with highest number being almost 2000.  Past Medical History:  Diagnosis Date   Arthritis    Diabetes mellitus without complication (HCC)    on meds   Gout    Hyperlipidemia    on meds   Hypertension    on meds   Sleep apnea    uses CPAP    Past Surgical History:  Procedure Laterality Date   BICEPS TENDON REPAIR Left    COLONOSCOPY WITH PROPOFOL  N/A 01/01/2022    Procedure: COLONOSCOPY WITH PROPOFOL ;  Surgeon: Wilhelmenia Aloha Raddle., MD;  Location: [facility];  Service: Gastroenterology;  Laterality: N/A;   COLONOSCOPY WITH PROPOFOL  N/A 06/24/2022   Procedure: COLONOSCOPY WITH PROPOFOL ;  Surgeon: Wilhelmenia Aloha Raddle., MD;  Location: [facility];  Service: Gastroenterology;  Laterality: N/A;   COLONOSCOPY WITH PROPOFOL  N/A 03/31/2023   Procedure: COLONOSCOPY WITH PROPOFOL ;  Surgeon: Mansouraty, Aloha Raddle., MD;  Location: [facility];  Service: Gastroenterology;  Laterality: N/A;   ENDOSCOPIC MUCOSAL RESECTION N/A 01/01/2022   Procedure: ENDOSCOPIC MUCOSAL RESECTION;  Surgeon: Wilhelmenia Aloha Raddle., MD;  Location: [facility];  Service: Gastroenterology;  Laterality: N/A;  ICV polyp   ENDOSCOPIC MUCOSAL RESECTION N/A 06/24/2022   Procedure: ENDOSCOPIC MUCOSAL RESECTION;  Surgeon: Wilhelmenia Aloha Raddle., MD;  Location: [facility];  Service: Gastroenterology;  Laterality: N/A;   ENDOSCOPIC MUCOSAL RESECTION N/A 03/31/2023   Procedure: ENDOSCOPIC MUCOSAL RESECTION;  Surgeon: Wilhelmenia Aloha Raddle., MD;  Location: [facility];  Service: Gastroenterology;  Laterality: N/A;   HEMOSTASIS CLIP PLACEMENT  06/24/2022   Procedure: HEMOSTASIS CLIP PLACEMENT;  Surgeon: Wilhelmenia Aloha Raddle., MD;  Location: [facility];  Service: Gastroenterology;;   HEMOSTASIS CONTROL  03/31/2023   Procedure: HEMOSTASIS CONTROL;  Surgeon: Wilhelmenia Aloha Raddle., MD;  Location: [facility];  Service: Gastroenterology;;   HERNIA REPAIR  2019   HIP SURGERY Right 01/2022   HOT HEMOSTASIS N/A 01/01/2022   Procedure: HOT HEMOSTASIS (ARGON PLASMA COAGULATION/BICAP);  Surgeon: Wilhelmenia Aloha Raddle., MD;  Location: [facility];  Service: Gastroenterology;  Laterality: N/A;  ICV EMR   KNEE SURGERY Left 2018   POLYPECTOMY  01/01/2022   Procedure: POLYPECTOMY;  Surgeon: Mansouraty, Aloha Raddle., MD;  Location: [facility];  Service: Gastroenterology;;  cecal   POLYPECTOMY   06/24/2022   Procedure: POLYPECTOMY;  Surgeon: Wilhelmenia Aloha Raddle., MD;  Location: [facility];  Service: Gastroenterology;;   ROBLEY LIFTING INJECTION  01/01/2022   Procedure: SUBMUCOSAL LIFTING INJECTION;  Surgeon: Wilhelmenia Aloha Raddle., MD;  Location: [facility];  Service: Gastroenterology;;  ICV EMR   SUBMUCOSAL LIFTING INJECTION  06/24/2022   Procedure: SUBMUCOSAL LIFTING INJECTION;  Surgeon: Wilhelmenia Aloha Raddle., MD;  Location: [facility];  Service: Gastroenterology;;    Current Medications: Current Meds  Medication Sig   acyclovir  (ZOVIRAX ) 400 MG tablet TAKE 1 TABLET TWICE A DAY (Patient taking differently: Take 400 mg by mouth 2 (two) times daily.)   allopurinol  (ZYLOPRIM ) 300 MG tablet Take 1 tablet (300 mg total) by mouth daily.   ALPRAZolam  (XANAX ) 0.5 MG tablet TAKE 1 TABLET BY MOUTH AT BEDTIME AS NEEDED FOR ANXIETY (Patient taking differently: Take 0.5 mg by mouth at bedtime as needed for anxiety. TAKE 1 TABLET BY MOUTH AT BEDTIME AS NEEDED FOR ANXIETY)   atorvastatin  (LIPITOR) 40 MG tablet Take 1 tablet (40 mg total) by mouth daily.   buPROPion  (WELLBUTRIN  XL) 300 MG 24 hr tablet TAKE 1 TABLET DAILY (Patient taking differently: Take 300 mg by mouth daily.)   citalopram  (CELEXA ) 10 MG tablet Take 1 tablet (10 mg total) by mouth daily.   dapagliflozin  propanediol (FARXIGA ) 10 MG TABS tablet Take 1 tablet (10 mg total) by mouth daily before breakfast.   fenofibrate  micronized (LOFIBRA) 200 MG capsule Take 1 capsule (200 mg total) by mouth daily before breakfast.   icosapent  Ethyl (VASCEPA ) 1 g capsule Take 2 capsules (2 g total) by mouth 2 (two) times daily.   lisinopril -hydrochlorothiazide  (ZESTORETIC ) 20-12.5 MG tablet TAKE 1 TABLET DAILY (Patient taking differently: Take 1 tablet by mouth daily.)   sildenafil  (VIAGRA ) 100 MG tablet Take 0.5-1 tablets (50-100 mg total) by mouth daily as needed for erectile dysfunction.   Vitamin D , Ergocalciferol , (DRISDOL ) 1.25 MG  (50000 UNIT) CAPS capsule TAKE 1 CAPSULE EVERY 7 DAYS (Patient taking differently: Take 50,000 Units by mouth every 7 (seven) days. TAKE 1 CAPSULE EVERY 7 DAYS)   [DISCONTINUED] Semaglutide , 2 MG/DOSE, 8 MG/3ML SOPN Inject 2 mg as directed once a week.     Allergies:   Metformin and related   Social History   Socioeconomic History   Marital status: Single    Spouse name: Not on file   Number of children: Not on file   Years of education: Not on file   Highest education level: Master's degree (e.g., MA, MS, MEng, MEd, MSW, MBA)  Occupational History   Not on file  Tobacco Use   Smoking status: Never   Smokeless tobacco: Never  Vaping Use   Vaping status: Never Used  Substance and Sexual Activity   Alcohol use: Yes    Alcohol/week: 3.0 standard drinks of alcohol    Types: 3 Standard drinks or equivalent per week   Drug use: Never   Sexual activity: Not on file  Other Topics Concern   Not on file  Social History Narrative   Not on file   Social Drivers of Health   Financial Resource Strain: Low Risk  (06/15/2023)   Overall Financial Resource Strain (CARDIA)    Difficulty  of Paying Living Expenses: Not hard at all  Food Insecurity: No Food Insecurity (06/15/2023)   Hunger Vital Sign    Worried About Running Out of Food in the Last Year: Never true    Ran Out of Food in the Last Year: Never true  Transportation Needs: No Transportation Needs (06/15/2023)   PRAPARE - Administrator, Civil Service (Medical): No    Lack of Transportation (Non-Medical): No  Physical Activity: Unknown (06/15/2023)   Exercise Vital Sign    Days of Exercise per Week: 0 days    Minutes of Exercise per Session: Not on file  Stress: Stress Concern Present (06/15/2023)   [employer] of Occupational Health - Occupational Stress Questionnaire    Feeling of Stress : To some extent  Social Connections: Unknown (06/15/2023)   Social Connection and Isolation Panel [NHANES]     Frequency of Communication with Friends and Family: More than three times a week    Frequency of Social Gatherings with Friends and Family: More than three times a week    Attends Religious Services: Patient declined    Database Administrator or Organizations: No    Attends Engineer, Structural: Not on file    Marital Status: Living with partner     Family History: The patient's family history includes Colon polyps (age of onset: 72) in his brother and sister; Heart disease in his father, paternal grandfather, and paternal grandmother. There is no history of Colon cancer, Esophageal cancer, Rectal cancer, Stomach cancer, Inflammatory bowel disease, Liver disease, or Pancreatic cancer. ROS:   Please see the history of present illness.    All 14 point review of systems negative except as described per history of present illness.  EKGs/Labs/Other Studies Reviewed:    The following studies were reviewed today:   EKG:       Recent Labs: 01/13/2023: TSH 2.00 06/16/2023: ALT 23; BUN 11; Creatinine, Ser 0.98; Potassium 4.1; Sodium 136  Recent Lipid Panel    Component Value Date/Time   CHOL 167 06/16/2023 1008   TRIG (H) 06/16/2023 1008    1245.0 Triglyceride is over 400; calculations on Lipids are invalid.   HDL 22.10 (L) 06/16/2023 1008   CHOLHDL 8 06/16/2023 1008   VLDL 63.6 (H) 03/23/2021 0932   LDLDIRECT 31.0 06/16/2023 1008    Physical Exam:    VS:  BP 138/80 (BP Location: Right Arm, Patient Position: Sitting)   Pulse 81   Ht 6' (1.829 m)   Wt 281 lb (127.5 kg)   SpO2 94%   BMI 38.11 kg/m     Wt Readings from Last 3 Encounters:  08/05/23 281 lb (127.5 kg)  07/30/23 282 lb (127.9 kg)  07/29/23 281 lb 9.6 oz (127.7 kg)     GEN:  Well nourished, well developed in no acute distress HEENT: Normal NECK: No JVD; No carotid bruits LYMPHATICS: No lymphadenopathy CARDIAC: RRR, no murmurs, no rubs, no gallops RESPIRATORY:  Clear to auscultation without rales,  wheezing or rhonchi  ABDOMEN: Soft, non-tender, non-distended MUSCULOSKELETAL:  No edema; No deformity  SKIN: Warm and dry NEUROLOGIC:  Alert and oriented x 3 PSYCHIATRIC:  Normal affect   ASSESSMENT:    1. Essential hypertension   2. Dyspnea on exertion   3. Obstructive sleep apnea syndrome   4. Type 2 diabetes mellitus with obesity (HCC)   5. Mixed hyperlipidemia   6. Atypical chest pain    PLAN:    In order of problems listed  above:  Multiple risk factors for coronary artery disease I am really worried about family history of premature coronary artery disease. Will recheck his cholesterol will check his LP(a) as well.  I am worried about his symptomatology being somewhat atypical and may indicate angina equivalent.  We had a long discussion about which way to approach this problem I think the best test will be coronary CT angio.  That will allow me to estimate any significant coronary artery disease. Dyspnea on exertion.  Will schedule him to have an echocardiogram to assess left ventricle ejection fraction. Type 2 diabetes.  Recent hemoglobin A1c is 8.6 previously was better.  However he admits that he stopped exercising on the regular basis and that probably what led to worsening of his diabetes. Hypertriglyceridemia he is already on Vascepa  as well as fenofibrate  which is a good choice however I think proper exercises proper control diabetes and weight loss and and good diet will be able to control his triglycerides better so we spent great of time talking about need to exercise on the regular basis at least 5 times a week half an hour moderate intensity exercise favorably more.  He is thinking about potentially getting some equipment including elliptical and stationary bike which I think will be excellent idea.  I also talked to him about CGM, potentially getting in touch with company like [employer] that allowed him to use CGM and access to nutritionist which could be tremendously  beneficial for him.   Medication Adjustments/Labs and Tests Ordered: Current medicines are reviewed at length with the patient today.  Concerns regarding medicines are outlined above.  Orders Placed This Encounter  Procedures   EKG 12-Lead   No orders of the defined types were placed in this encounter.   Signed, Lamar DOROTHA Fitch, MD, [facility]. 08/05/2023 3:23 PM    [facility]

## 2023-08-05 NOTE — Telephone Encounter (Signed)
 Error

## 2023-08-05 NOTE — Addendum Note (Signed)
 Addended by: Baldo Ash D on: 08/05/2023 03:45 PM   Modules accepted: Orders

## 2023-08-06 ENCOUNTER — Encounter: Payer: Self-pay | Admitting: Family Medicine

## 2023-08-06 ENCOUNTER — Telehealth: Payer: Self-pay

## 2023-08-06 LAB — BASIC METABOLIC PANEL
BUN/Creatinine Ratio: 15 (ref 9–20)
BUN: 15 mg/dL (ref 6–24)
CO2: 24 mmol/L (ref 20–29)
Calcium: 10.2 mg/dL (ref 8.7–10.2)
Chloride: 97 mmol/L (ref 96–106)
Creatinine, Ser: 0.98 mg/dL (ref 0.76–1.27)
Glucose: 168 mg/dL — ABNORMAL HIGH (ref 70–99)
Potassium: 4 mmol/L (ref 3.5–5.2)
Sodium: 140 mmol/L (ref 134–144)
eGFR: 93 mL/min/{1.73_m2} (ref >59)

## 2023-08-06 NOTE — Telephone Encounter (Addendum)
PA initiated via [email or web address], EID: [id number]. Awaiting determination.

## 2023-08-07 ENCOUNTER — Encounter: Payer: Self-pay | Admitting: Neurology

## 2023-08-07 NOTE — Telephone Encounter (Signed)
Tried calling [employer] to see if Ozempic claim is going through. Had to hang up- can reach member services at [phone]. Will try to call back tomorrow

## 2023-08-07 NOTE — Telephone Encounter (Signed)
PA cancelled by plan. Currently has an approved authorization on file until 04/01/24

## 2023-08-08 ENCOUNTER — Telehealth: Payer: Self-pay

## 2023-08-08 ENCOUNTER — Other Ambulatory Visit: Payer: Self-pay

## 2023-08-08 MED ORDER — FREESTYLE LIBRE 3 SENSOR MISC: 5 refills | Status: AC

## 2023-08-08 NOTE — Telephone Encounter (Signed)
PA initiated via [email or web address], EID: [id number]. Awaiting determination.

## 2023-08-12 NOTE — Telephone Encounter (Signed)
Mychart message was sent to pt letting him know.

## 2023-08-12 NOTE — Telephone Encounter (Signed)
Please inform patient. Thank you

## 2023-08-12 NOTE — Telephone Encounter (Signed)
PA denied.   Jesse [name] can be covered when patient is currently taking insulin for glycemic control or has had level 2 hypoglycemia event in the past 6 months or a level 3 hypoglycemic event in the past 6 months.

## 2023-08-15 ENCOUNTER — Telehealth: Payer: Self-pay

## 2023-08-15 NOTE — Telephone Encounter (Signed)
-----   Message from Gypsy Balsam sent at 08/07/2023 12:07 PM EST ----- Chem-7 looks good, proceed with coronary scans

## 2023-08-15 NOTE — Telephone Encounter (Signed)
Patient notified through my chart.

## 2023-08-25 ENCOUNTER — Telehealth: Payer: Self-pay

## 2023-08-25 NOTE — Telephone Encounter (Signed)
PA initiated via [email or web address]; EID: [id number]. Awaiting determination.

## 2023-08-26 NOTE — Telephone Encounter (Signed)
PA approved. Effective 08/26/23 to 08/24/25.

## 2023-08-26 NOTE — Telephone Encounter (Signed)
Sent pt mychart message letting him known Jesse [name] 10mg  was approved.

## 2023-08-27 ENCOUNTER — Ambulatory Visit: Payer: BC Managed Care – PPO | Admitting: Neurology

## 2023-08-27 DIAGNOSIS — E6609 Other obesity due to excess calories: Secondary | ICD-10-CM

## 2023-08-27 DIAGNOSIS — G4733 Obstructive sleep apnea (adult) (pediatric): Secondary | ICD-10-CM

## 2023-08-27 DIAGNOSIS — E1169 Type 2 diabetes mellitus with other specified complication: Secondary | ICD-10-CM

## 2023-08-27 DIAGNOSIS — E66812 Obesity, class 2: Secondary | ICD-10-CM

## 2023-08-27 DIAGNOSIS — G4736 Sleep related hypoventilation in conditions classified elsewhere: Secondary | ICD-10-CM

## 2023-08-27 DIAGNOSIS — R0683 Snoring: Secondary | ICD-10-CM

## 2023-09-03 NOTE — Progress Notes (Signed)
 [facility]  Jesse [name] 53 year old male 11-May-1971   HOME SLEEP TEST REPORT ( by Watch PAT)    MAIL OUT DEVICE  STUDY DATE:  08-30-2023/ Data load  09-09-2023    ORDERING CLINICIAN: Dedra Gores, MD  REFERRING CLINICIAN:  Mabel Pry, DO    CLINICAL INFORMATION/HISTORY: Jesse [name] is a 53 y.o. Caucasian male patient who is seen upon Dr Gena  referral on 07/30/2023 for a TOC of OSA, CPAP needs. He has been on CPAP for over a decade, estimated it may be  25 years, and his current machine is at least 53 years old.  No retesting done in a decade ,his last sleep study was a full PSG in [facility], in the sleep lab of Dr Daralyn. Chief concern according to patient :   I feel much more fatigued in AM, I snore through the machine and mask, and now wanted to get a new machine.  The patient wakes up at 7 AM with an alarm. 7.15  AM is the usual rise time. He feels  it is now a struggle to get up.  He reports not feeling refreshed or restored in AM.    His  father and brother are on CPAP with OSA, father with CAD and DM,   He is a highly compliant CPAP user with 100% compliance by hours and by days.  The average use of time is 7 hours 45 minutes.  He is currently an air sense 10 auto titration device by [employer] with a minimum pressure setting of 5 cm water and a maximum of 10 with expiratory relief of 3 cm water.   The residual AHI is 0.7 which is very low index.  The 95th percentile air leak is low at 6.6 L this speaks for good fitting of the mask but he does report that he snores through the mask.  The pressure at the 95th percentile is actually maxed out he is using 10 cm water pressure for the whole night and that is exactly where he is capped by his current settings. I reset the max setting to 13 cm water for now.    Epworth sleepiness score:  8/ 24 points , FSS endorsed at 47/ 63 points.     BMI: 38 kg/m   Neck Circumference: 19.5     FINDINGS:    Sleep Summary:   Total Recording Time (hours, min):   8 h 53 m      Total Sleep Time (hours, min):  8 h 2 m              Percent REM (%):   12.6%                                     Respiratory Indices:   Calculated pAHI (per hour):    60 /h                        REM pAHI:    64 /h                                            NREM pAHI:     59/h  Positional  AHI: Supine  67.4/h non supine 48.5/h                                                    Oxygen Saturation Statistics:   Oxygen Saturation (%) Mean:  93%               O2 Saturation Range (%):    nadir of 79% through 99%                                   O2 Saturation (minutes) <89%:        50 minutes !!!   Pulse Rate Statistics:   Pulse Mean (bpm):    75 bpm             Pulse Range:     55 - 118 bpm            IMPRESSION:  This HST confirms the presence of severe and according to the calculation of this device all obstructive sleep apnea with an AHI of 60 there was no significant REM sleep accentuation positional also played only a minor role.  Very concerning is the degree of hypoxia that [name] presented with a nadir of 79% oxygen saturation and a total of 15 minutes over 10% of the sleep study in hypoxemia are clinically significant.   If he would use an oxygen saturation under 90% as a guideline, the desaturation time would have been 71 minutes -close to 15% of total sleep time. Snoring was moderately - loud   RECOMMENDATION: I would like for the patient to have immediate access to CPAP but I remain concerned that this may not treat the hypoxemia sufficiently alongside apnea.  I have therefor asked for an in-lab titration , optionally with oxygen ( if needed) , at [facility] sleep to address this issue promptly . The patient will be on the work-in (cancellation) list.   If his previous sleep studies have not shown hypoxemia , then there is also a concern that another cardiovascular disease  /pulmonary disease has affected the patient.  I have asked the sleep lab to fast track the authorization for an in-lab study .  March 3rd.     INTERPRETING PHYSICIAN:   Dedra Gores, MD   [facility] and [facility] Sleep Board certified by The [employer] of Sleep Medicine and Diplomate of the [employer] of Sleep Medicine. Board certified In Neurology through the ABPN, Fellow of the [employer] of Neurology.

## 2023-09-04 ENCOUNTER — Ambulatory Visit (HOSPITAL_BASED_OUTPATIENT_CLINIC_OR_DEPARTMENT_OTHER)
Admission: RE | Admit: 2023-09-04 | Discharge: 2023-09-04 | Disposition: A | Discharge status: Home / Self Care | Payer: BC Managed Care – PPO | Source: Ambulatory Visit | Attending: Cardiology | Admitting: Cardiology

## 2023-09-04 DIAGNOSIS — R0609 Other forms of dyspnea: Secondary | ICD-10-CM | POA: Diagnosis not present

## 2023-09-10 LAB — ECHOCARDIOGRAM COMPLETE
AR max vel: 2.11 cm2
AV Area VTI: 2.23 cm2
AV Area mean vel: 2.19 cm2
AV Mean grad: 8 mm[Hg]
AV Peak grad: 14.7 mm[Hg]
Ao pk vel: 1.92 m/s
Area-P 1/2: 3.27 cm2
Calc EF: 66.2 %
MV M vel: 5.26 m/s
MV Peak grad: 110.7 mm[Hg]
S' Lateral: 2.6 cm
Single Plane A2C EF: 66.3 %
Single Plane A4C EF: 65.3 %

## 2023-09-12 ENCOUNTER — Other Ambulatory Visit: Payer: Self-pay | Admitting: Neurology

## 2023-09-12 ENCOUNTER — Telehealth: Payer: Self-pay

## 2023-09-12 DIAGNOSIS — G4733 Obstructive sleep apnea (adult) (pediatric): Secondary | ICD-10-CM

## 2023-09-12 DIAGNOSIS — G4736 Sleep related hypoventilation in conditions classified elsewhere: Secondary | ICD-10-CM

## 2023-09-12 DIAGNOSIS — R0609 Other forms of dyspnea: Secondary | ICD-10-CM

## 2023-09-12 NOTE — Procedures (Signed)
 [facility]  Leanard Dimaio 53 year old male February 14, 1971   HOME SLEEP TEST REPORT ( by Watch PAT)    MAIL OUT DEVICE  STUDY DATE:  08-30-2023/ Data load  09-09-2023    ORDERING CLINICIAN: Melvyn Novas, MD  REFERRING CLINICIAN:  Arva Chafe, DO    CLINICAL INFORMATION/HISTORY: Jesse [name] is a 53 y.o. Caucasian male patient who is seen upon Dr Hollie Beach  referral on 07/30/2023 for a TOC of OSA, CPAP needs. He has been on CPAP for over a decade, estimated it may be  25 years, and his current machine is at least 53 years old.  No retesting done in a decade ,his last sleep study was a full PSG in [facility], in the sleep lab of Dr Johnell Comings. Chief concern according to patient :  " I feel much more fatigued in AM, I snore through the machine and mask, and now wanted to get a new machine".  The patient wakes up at 7 AM with an alarm. 7.15  AM is the usual rise time. He feels  it is now a struggle to get up.  He reports not feeling refreshed or restored in AM.    His  father and brother are on CPAP with OSA, father with CAD and DM,   He is a highly compliant CPAP user with 100% compliance by hours and by days.  The average use of time is 7 hours 45 minutes.  He is currently an air sense 10 auto titration device by [employer] with a minimum pressure setting of 5 cm water and a maximum of 10 with expiratory relief of 3 cm water.   The residual AHI is 0.7 which is very low index.  The 95th percentile air leak is low at 6.6 L this speaks for good fitting of the mask but he does report that he snores through the mask.  The pressure at the 95th percentile is actually maxed out he is using 10 cm water pressure for the whole night and that is exactly where he is capped by his current settings. I reset the max setting to 13 cm water for now.    Epworth sleepiness score:  8/ 24 points , FSS endorsed at 47/ 63 points.     BMI: 38 kg/m   Neck Circumference: 19.5 "    FINDINGS:    Sleep Summary:   Total Recording Time (hours, min):   8 h 53 m      Total Sleep Time (hours, min):  8 h 2 m              Percent REM (%):   12.6%                                     Respiratory Indices:   Calculated pAHI (per hour):    60 /h                        REM pAHI:    64 /h                                            NREM pAHI:     59/h  Positional  AHI: Supine  67.4/h non supine 48.5/h                                                    Oxygen Saturation Statistics:   Oxygen Saturation (%) Mean:  93%               O2 Saturation Range (%):    nadir of 79% through 99%                                   O2 Saturation (minutes) <89%:        50 minutes !!!   Pulse Rate Statistics:   Pulse Mean (bpm):    75 bpm             Pulse Range:     55 - 118 bpm            IMPRESSION:  This HST confirms the presence of severe and according to the calculation of this device all obstructive sleep apnea with an AHI of 60 there was no significant REM sleep accentuation positional also played only a minor role.  Very concerning is the degree of hypoxia that Mr. [name] presented with a nadir of 79% oxygen saturation and a total of 15 minutes over 10% of the sleep study in hypoxemia are clinically significant.   If he would use an oxygen saturation under 90% as a guideline, the desaturation time would have been 71 minutes -close to 15% of total sleep time. Snoring was moderately - loud   RECOMMENDATION: I would like for the patient to have immediate access to CPAP but I remain concerned that this may not treat the hypoxemia sufficiently alongside apnea.  I have therefor asked for an in-lab titration , optionally with oxygen ( if needed) , at [facility] sleep to address this issue promptly . The patient will be on the work-in (cancellation) list.   If his previous sleep studies have not shown hypoxemia , then there is also a concern that another cardiovascular disease  /pulmonary disease has affected the patient.  I have asked the sleep lab to fast track the authorization for an in-lab study .  March 3rd.     INTERPRETING PHYSICIAN:   Melvyn Novas, MD   [facility] and [facility] Sleep Board certified by The [employer] of Sleep Medicine and Diplomate of the [employer] of Sleep Medicine. Board certified In Neurology through the ABPN, Fellow of the [employer] of Neurology.

## 2023-09-12 NOTE — Telephone Encounter (Signed)
-----   Message from Gypsy Balsam sent at 09/11/2023 10:01 AM EST ----- Echocardiogram showed normal left ventricle ejection fraction overall looks good

## 2023-09-12 NOTE — Telephone Encounter (Signed)
 Patient notified of results and verbalized understanding.

## 2023-09-12 NOTE — Progress Notes (Signed)
 Jesse [name]'s HST confirms the presence of severe and according to the calculation of this device all obstructive sleep apnea with an AHI of 60 there was no significant REM sleep accentuation positional also played only a minor role.  Very concerning is the degree of hypoxia that Jesse [name] presented with a nadir of 79% oxygen saturation and a total of 15 minutes over 10% of the sleep study in hypoxemia are clinically significant.   If he would use an oxygen saturation under 90% as a guideline, the desaturation time would have been 71 minutes -close to 15% of total sleep time. Snoring was moderately - loud   RECOMMENDATION: I would like for the patient to have immediate access to CPAP but I remain concerned that this may not treat the hypoxemia sufficiently alongside apnea.   I have therefor asked for an in-lab titration , optionally with oxygen ( if needed) , at [facility] sleep to address this issue promptly . The patient will be on the work-in (cancellation) list.    If his previous sleep studies have not shown hypoxemia , then there is also a concern that another cardiovascular disease /pulmonary disease has affected the patient.   I have asked the sleep lab to fast track the authorization for an in-lab study .   Melvyn Novas, MD

## 2023-09-16 ENCOUNTER — Other Ambulatory Visit (HOSPITAL_BASED_OUTPATIENT_CLINIC_OR_DEPARTMENT_OTHER): Payer: BC Managed Care – PPO

## 2023-09-18 ENCOUNTER — Telehealth: Payer: Self-pay | Admitting: *Deleted

## 2023-09-18 NOTE — Telephone Encounter (Signed)
-----   Message from Nurse Toma Copier C sent at 09/17/2023 12:29 PM EST -----  ----- Message ----- From: Melvyn Novas, MD Sent: 09/12/2023  10:56 AM EST To: Jilda Roche Wendling, DO; Gna-Pod 3 Results  Severe sleep apnea , obstructive with severe sleep hypoxia- see nadir and duration.   Calculated pAHI (per hour):  60 /h , O2 Saturation Range (%):  nadir of 79% through 99% . O2 Saturation (minutes) <89%:50 minutes !!! This HST confirms the presence of severe and according to the calculation of this device all obstructive sleep apnea with an AHI of 60 there was no significant REM sleep accentuation positional also played only a minor role.  Very concerning is the degree of hypoxia that Mr. Jesse [name] presented with a nadir of 79% oxygen saturation and a total of 15 minutes over 10% of the sleep study in hypoxemia are clinically significant.   If he would use an oxygen saturation under 90% as a guideline, the desaturation time would have been 71 minutes -close to 15% of total sleep time. Snoring was moderately - loud   RECOMMENDATION: I would like for the patient to have immediate access to CPAP but I remain concerned that this may not treat the hypoxemia sufficiently alongside apnea.   I have therefor asked for an in-lab titration , optionally with oxygen ( if needed) , at [facility] sleep to address this issue promptly . The patient will be on the work-in (cancellation) list.    If his previous sleep studies have not shown hypoxemia , then there is also a concern that another cardiovascular disease /pulmonary disease has affected the patient.   I have asked the sleep lab to fast track the authorization for an in-lab study . Presumed to be March 3rd.

## 2023-09-18 NOTE — Telephone Encounter (Signed)
 I called the patient and LVM (Ok per [facility] advising patient that his sleep test showed severe OSA and a nadir of 79%. I informed the patient that Dr Vickey Huger wants him on cpap as soon as possible and in order to ensure he is on the correct settings, she has ordered a cpap titration study to be done as soon as possible. I left our office number in the message for patient to call us if he has any questions. I also sent him a [employer].

## 2023-09-19 ENCOUNTER — Encounter (HOSPITAL_COMMUNITY): Payer: Self-pay

## 2023-09-22 ENCOUNTER — Telehealth: Payer: Self-pay | Admitting: Neurology

## 2023-09-22 NOTE — Telephone Encounter (Signed)
 CPAP [employer] no auth req spoke to [place] [facility] ref # [id number].  Sent mychart

## 2023-09-22 NOTE — Telephone Encounter (Signed)
 Noted, I have sent the patient a mychart message about scheduling his SS.

## 2023-09-23 ENCOUNTER — Ambulatory Visit (HOSPITAL_BASED_OUTPATIENT_CLINIC_OR_DEPARTMENT_OTHER): Payer: BC Managed Care – PPO

## 2023-09-26 ENCOUNTER — Ambulatory Visit: Payer: BC Managed Care – PPO | Admitting: Family Medicine

## 2023-10-03 ENCOUNTER — Encounter: Payer: Self-pay | Admitting: Family Medicine

## 2023-10-03 ENCOUNTER — Ambulatory Visit: Payer: BC Managed Care – PPO | Admitting: Family Medicine

## 2023-10-03 ENCOUNTER — Other Ambulatory Visit: Payer: Self-pay | Admitting: Family Medicine

## 2023-10-03 VITALS — BP 134/86 | HR 70 | Ht 72.0 in | Wt 277.6 lb

## 2023-10-03 DIAGNOSIS — E669 Obesity, unspecified: Secondary | ICD-10-CM

## 2023-10-03 DIAGNOSIS — E1169 Type 2 diabetes mellitus with other specified complication: Secondary | ICD-10-CM

## 2023-10-03 DIAGNOSIS — Z7985 Long-term (current) use of injectable non-insulin antidiabetic drugs: Secondary | ICD-10-CM

## 2023-10-03 DIAGNOSIS — Z7984 Long term (current) use of oral hypoglycemic drugs: Secondary | ICD-10-CM

## 2023-10-03 DIAGNOSIS — E782 Mixed hyperlipidemia: Secondary | ICD-10-CM

## 2023-10-03 LAB — LIPID PANEL
Cholesterol: 137 mg/dL (ref 0–200)
HDL: 27.7 mg/dL — ABNORMAL LOW (ref >39.00)
NonHDL: 109.55
Total CHOL/HDL Ratio: 5
Triglycerides: 537 mg/dL — ABNORMAL HIGH (ref 0.0–149.0)
VLDL: 107.4 mg/dL — ABNORMAL HIGH (ref 0.0–40.0)

## 2023-10-03 LAB — COMPREHENSIVE METABOLIC PANEL
ALT: 23 U/L (ref 0–53)
AST: 32 U/L (ref 0–37)
Albumin: 4.9 g/dL (ref 3.5–5.2)
Alkaline Phosphatase: 46 U/L (ref 39–117)
BUN: 13 mg/dL (ref 6–23)
CO2: 30 meq/L (ref 19–32)
Calcium: 9.8 mg/dL (ref 8.4–10.5)
Chloride: 98 meq/L (ref 96–112)
Creatinine, Ser: 1.08 mg/dL (ref 0.40–1.50)
GFR: 79.06 mL/min (ref >60.00)
Glucose, Bld: 157 mg/dL — ABNORMAL HIGH (ref 70–99)
Potassium: 4.1 meq/L (ref 3.5–5.1)
Sodium: 138 meq/L (ref 135–145)
Total Bilirubin: 0.6 mg/dL (ref 0.2–1.2)
Total Protein: 7.6 g/dL (ref 6.0–8.3)

## 2023-10-03 LAB — LDL CHOLESTEROL, DIRECT: Direct LDL: 48 mg/dL

## 2023-10-03 LAB — HEMOGLOBIN A1C: Hgb A1c MFr Bld: 7.5 % — ABNORMAL HIGH (ref 4.6–6.5)

## 2023-10-03 MED ORDER — DAPAGLIFLOZIN PROPANEDIOL 10 MG PO TABS
10.0000 mg | ORAL_TABLET | Freq: Every day | ORAL | 2 refills | Status: DC
Start: 2023-10-03 — End: 2023-10-03

## 2023-10-03 MED ORDER — EMPAGLIFLOZIN 25 MG PO TABS: 25.0000 mg | ORAL_TABLET | Freq: Every day | ORAL | 2 refills | Status: DC

## 2023-10-03 NOTE — Patient Instructions (Addendum)
 Give [place] 2-3 business days to get the results of your labs back.   Keep the diet clean and stay active.  Aim to do some physical exertion for 150 minutes per week. This is typically divided into 5 days per week, 30 minutes per day. The activity should be enough to get your heart rate up. Anything is better than nothing if you have time constraints.  Send me some sugar readings in 6-7 weeks. I want to know if you are able to get the [place] or not in the next several days.   Let us know if you need anything.  EXERCISES RANGE OF MOTION (ROM) AND STRETCHING EXERCISES  These exercises may help you when beginning to rehabilitate your issue. In order to successfully resolve your symptoms, you must improve your posture. These exercises are designed to help reduce the forward-head and rounded-shoulder posture which contributes to this condition. Your symptoms may resolve with or without further involvement from your physician, physical therapist or athletic trainer. While completing these exercises, remember:  Restoring tissue flexibility helps normal motion to return to the joints. This allows healthier, less painful movement and activity. An effective stretch should be held for at least 20 seconds, although you may need to begin with shorter hold times for comfort. A stretch should never be painful. You should only feel a gentle lengthening or release in the stretched tissue. Do not do any stretch or exercise that you cannot tolerate.  STRETCH- Axial Extensors Lie on your back on the floor. You may bend your knees for comfort. Place a rolled-up hand towel or dish towel, about 2 inches in diameter, under the part of your head that makes contact with the floor. Gently tuck your chin, as if trying to make a "double chin," until you feel a gentle stretch at the base of your head. Hold 15-20 seconds. Repeat 2-3 times. Complete this exercise 1 time per day.   STRETCH - Axial Extension  Stand or sit on a  firm surface. Assume a good posture: chest up, shoulders drawn back, abdominal muscles slightly tense, knees unlocked (if standing) and feet hip width apart. Slowly retract your chin so your head slides back and your chin slightly lowers. Continue to look straight ahead. You should feel a gentle stretch in the back of your head. Be certain not to feel an aggressive stretch since this can cause headaches later. Hold for 15-20 seconds. Repeat 2-3 times. Complete this exercise 1 time per day.  STRETCH - Cervical Side Bend  Stand or sit on a firm surface. Assume a good posture: chest up, shoulders drawn back, abdominal muscles slightly tense, knees unlocked (if standing) and feet hip width apart. Without letting your nose or shoulders move, slowly tip your right / left ear to your shoulder until your feel a gentle stretch in the muscles on the opposite side of your neck. Hold 15-20 seconds. Repeat 2-3 times. Complete this exercise 1-2 times per day.  STRETCH - Cervical Rotators  Stand or sit on a firm surface. Assume a good posture: chest up, shoulders drawn back, abdominal muscles slightly tense, knees unlocked (if standing) and feet hip width apart. Keeping your eyes level with the ground, slowly turn your head until you feel a gentle stretch along the back and opposite side of your neck. Hold 15-20 seconds. Repeat 2-3 times. Complete this exercise 1-2 times per day.  RANGE OF MOTION - Neck Circles  Stand or sit on a firm surface. Assume a good  posture: chest up, shoulders drawn back, abdominal muscles slightly tense, knees unlocked (if standing) and feet hip width apart. Gently roll your head down and around from the back of one shoulder to the back of the other. The motion should never be forced or painful. Repeat the motion 10-20 times, or until you feel the neck muscles relax and loosen. Repeat 2-3 times. Complete the exercise 1-2 times per day. STRENGTHENING EXERCISES - Cervical Strain and  Sprain These exercises may help you when beginning to rehabilitate your injury. They may resolve your symptoms with or without further involvement from your physician, physical therapist, or athletic trainer. While completing these exercises, remember:  Muscles can gain both the endurance and the strength needed for everyday activities through controlled exercises. Complete these exercises as instructed by your physician, physical therapist, or athletic trainer. Progress the resistance and repetitions only as guided. You may experience muscle soreness or fatigue, but the pain or discomfort you are trying to eliminate should never worsen during these exercises. If this pain does worsen, stop and make certain you are following the directions exactly. If the pain is still present after adjustments, discontinue the exercise until you can discuss the trouble with your clinician.  STRENGTH - Cervical Flexors, Isometric Face a wall, standing about 6 inches away. Place a small pillow, a ball about 6-8 inches in diameter, or a folded towel between your forehead and the wall. Slightly tuck your chin and gently push your forehead into the soft object. Push only with mild to moderate intensity, building up tension gradually. Keep your jaw and forehead relaxed. Hold 10 to 20 seconds. Keep your breathing relaxed. Release the tension slowly. Relax your neck muscles completely before you start the next repetition. Repeat 2-3 times. Complete this exercise 1 time per day.  STRENGTH- Cervical Lateral Flexors, Isometric  Stand about 6 inches away from a wall. Place a small pillow, a ball about 6-8 inches in diameter, or a folded towel between the side of your head and the wall. Slightly tuck your chin and gently tilt your head into the soft object. Push only with mild to moderate intensity, building up tension gradually. Keep your jaw and forehead relaxed. Hold 10 to 20 seconds. Keep your breathing relaxed. Release the  tension slowly. Relax your neck muscles completely before you start the next repetition. Repeat 2-3 times. Complete this exercise 1 time per day.  STRENGTH - Cervical Extensors, Isometric  Stand about 6 inches away from a wall. Place a small pillow, a ball about 6-8 inches in diameter, or a folded towel between the back of your head and the wall. Slightly tuck your chin and gently tilt your head back into the soft object. Push only with mild to moderate intensity, building up tension gradually. Keep your jaw and forehead relaxed. Hold 10 to 20 seconds. Keep your breathing relaxed. Release the tension slowly. Relax your neck muscles completely before you start the next repetition. Repeat 2-3 times. Complete this exercise 1 time per day.  POSTURE AND BODY MECHANICS CONSIDERATIONS Keeping correct posture when sitting, standing or completing your activities will reduce the stress put on different body tissues, allowing injured tissues a chance to heal and limiting painful experiences. The following are general guidelines for improved posture. Your physician or physical therapist will provide you with any instructions specific to your needs. While reading these guidelines, remember: The exercises prescribed by your provider will help you have the flexibility and strength to maintain correct postures. The correct  posture provides the optimal environment for your joints to work. All of your joints have less wear and tear when properly supported by a spine with good posture. This means you will experience a healthier, less painful body. Correct posture must be practiced with all of your activities, especially prolonged sitting and standing. Correct posture is as important when doing repetitive low-stress activities (typing) as it is when doing a single heavy-load activity (lifting).  PROLONGED STANDING WHILE SLIGHTLY LEANING FORWARD When completing a task that requires you to lean forward while standing in  one place for a long time, place either foot up on a stationary 2- to 4-inch high object to help maintain the best posture. When both feet are on the ground, the low back tends to lose its slight inward curve. If this curve flattens (or becomes too large), then the back and your other joints will experience too much stress, fatigue more quickly, and can cause pain.   RESTING POSITIONS Consider which positions are most painful for you when choosing a resting position. If you have pain with flexion-based activities (sitting, bending, stooping, squatting), choose a position that allows you to rest in a less flexed posture. You would want to avoid curling into a fetal position on your side. If your pain worsens with extension-based activities (prolonged standing, working overhead), avoid resting in an extended position such as sleeping on your stomach. Most people will find more comfort when they rest with their spine in a more neutral position, neither too rounded nor too arched. Lying on a non-sagging bed on your side with a pillow between your knees, or on your back with a pillow under your knees will often provide some relief. Keep in mind, being in any one position for a prolonged period of time, no matter how correct your posture, can still lead to stiffness.  WALKING Walk with an upright posture. Your ears, shoulders, and hips should all line up. OFFICE WORK When working at a desk, create an environment that supports good, upright posture. Without extra support, muscles fatigue and lead to excessive strain on joints and other tissues.  CHAIR: A chair should be able to slide under your desk when your back makes contact with the back of the chair. This allows you to work closely. The chair's height should allow your eyes to be level with the upper part of your monitor and your hands to be slightly lower than your elbows. Body position: Your feet should make contact with the floor. If this is not  possible, use a foot rest. Keep your ears over your shoulders. This will reduce stress on your neck and low back.

## 2023-10-03 NOTE — Progress Notes (Signed)
 Subjective:   Chief Complaint  Patient presents with   Diabetes    Patient presents today for a diabetes follow-up.   Quality Metric Gaps    Pneumococcal vaccine    Jesse [name] is a 53 y.o. male here for follow-up of diabetes.   [name]'s self monitored glucose range is mid 100's fasting.  Patient denies hypoglycemic reactions. He has a CGM.  Patient does not require insulin.   Medications include: Ozempic 2 mg/week Diet is OK, improving.  Exercise: walking No CP or SOB.  Past Medical History:  Diagnosis Date   Arthritis    Diabetes mellitus without complication (HCC)    on meds   Gout    Hyperlipidemia    on meds   Hypertension    on meds   Sleep apnea    uses CPAP     Related testing: Retinal exam: Done Pneumovax: not done  Objective:  BP 134/86   Pulse 70   Ht 6' (1.829 m)   Wt 277 lb 9.6 oz (125.9 kg)   SpO2 97%   BMI 37.65 kg/m  General:  Well developed, well nourished, in no apparent distress Lungs:  CTAB, no access msc use Cardio:  RRR, no bruits, no LE edema Psych: Age appropriate judgment and insight  Assessment:   Type 2 diabetes mellitus with obesity (HCC) - Plan: dapagliflozin propanediol (FARXIGA) 10 MG TABS tablet, Hemoglobin A1c, Comprehensive metabolic panel, Lipid panel   Plan:   Chronic, uncontrolled. Cont Ozempic 2 mg/week. Add Farxiga 10 mg/d, there were some Counseled on diet and exercise-needs to increase physical activity.  He has an appointment with the endocrinology team in the next mo or so. F/u in 3-6 mo. The patient voiced understanding and agreement to the plan.  [name], DO 10/03/23 11:05 AM

## 2023-10-04 ENCOUNTER — Encounter: Payer: Self-pay | Admitting: Family Medicine

## 2023-10-06 ENCOUNTER — Other Ambulatory Visit: Payer: Self-pay | Admitting: Family Medicine

## 2023-10-06 NOTE — Telephone Encounter (Signed)
 Requesting: alprazolam 0.5mg   Contract: None UDS: None Last Visit: 10/03/23 Next Visit: None Last Refill: 03/19/23 #30 and 3RF   Please Advise

## 2023-10-10 ENCOUNTER — Other Ambulatory Visit: Payer: Self-pay | Admitting: Family Medicine

## 2023-10-20 ENCOUNTER — Ambulatory Visit (INDEPENDENT_AMBULATORY_CARE_PROVIDER_SITE_OTHER): Admitting: Neurology

## 2023-10-20 ENCOUNTER — Other Ambulatory Visit: Payer: Self-pay | Admitting: Family Medicine

## 2023-10-20 DIAGNOSIS — G4733 Obstructive sleep apnea (adult) (pediatric): Secondary | ICD-10-CM

## 2023-10-20 DIAGNOSIS — G4736 Sleep related hypoventilation in conditions classified elsewhere: Secondary | ICD-10-CM | POA: Diagnosis not present

## 2023-10-20 DIAGNOSIS — R0609 Other forms of dyspnea: Secondary | ICD-10-CM

## 2023-10-23 ENCOUNTER — Other Ambulatory Visit: Payer: Self-pay | Admitting: Family Medicine

## 2023-10-24 ENCOUNTER — Encounter (HOSPITAL_COMMUNITY): Payer: Self-pay

## 2023-10-27 ENCOUNTER — Telehealth (HOSPITAL_COMMUNITY): Payer: Self-pay | Admitting: Emergency Medicine

## 2023-10-27 NOTE — Telephone Encounter (Signed)
 Reaching out to patient to offer assistance regarding upcoming cardiac imaging study; pt verbalizes understanding of appt date/time, parking situation and where to check in, pre-test NPO status and medications ordered, and verified current allergies; name and call back number provided for further questions should they arise Rockwell Alexandria RN Navigator Cardiac Imaging [facility] [phone] office [phone] cell

## 2023-10-28 ENCOUNTER — Ambulatory Visit (HOSPITAL_BASED_OUTPATIENT_CLINIC_OR_DEPARTMENT_OTHER)
Admission: RE | Admit: 2023-10-28 | Discharge: 2023-10-28 | Disposition: A | Discharge status: Home / Self Care | Source: Ambulatory Visit | Attending: Cardiology | Admitting: Cardiology

## 2023-10-28 ENCOUNTER — Encounter (HOSPITAL_BASED_OUTPATIENT_CLINIC_OR_DEPARTMENT_OTHER): Payer: Self-pay

## 2023-10-28 DIAGNOSIS — R931 Abnormal findings on diagnostic imaging of heart and coronary circulation: Secondary | ICD-10-CM | POA: Insufficient documentation

## 2023-10-28 DIAGNOSIS — R072 Precordial pain: Secondary | ICD-10-CM | POA: Diagnosis not present

## 2023-10-28 MED ORDER — NITROGLYCERIN 0.4 MG SL SUBL
SUBLINGUAL_TABLET | SUBLINGUAL | Status: AC
  Filled 2023-10-28: qty 2

## 2023-10-28 MED ORDER — METOPROLOL TARTRATE 5 MG/5ML IV SOLN
INTRAVENOUS | Status: AC
  Filled 2023-10-28: qty 5

## 2023-10-28 MED ORDER — NITROGLYCERIN 0.4 MG SL SUBL
0.8000 mg | SUBLINGUAL_TABLET | Freq: Once | SUBLINGUAL | Status: AC
Start: 2023-10-28 — End: 2023-10-28
  Administered 2023-10-28: 0.8 mg via SUBLINGUAL

## 2023-10-28 MED ORDER — IOHEXOL 350 MG/ML SOLN
100.0000 mL | Freq: Once | INTRAVENOUS | Status: AC | PRN
  Administered 2023-10-28: 95 mL via INTRAVENOUS

## 2023-10-31 ENCOUNTER — Ambulatory Visit (HOSPITAL_BASED_OUTPATIENT_CLINIC_OR_DEPARTMENT_OTHER)
Admission: RE | Admit: 2023-10-31 | Discharge: 2023-10-31 | Disposition: A | Discharge status: Home / Self Care | Source: Ambulatory Visit | Attending: Cardiology | Admitting: Cardiology

## 2023-10-31 ENCOUNTER — Other Ambulatory Visit: Payer: Self-pay | Admitting: Cardiology

## 2023-10-31 DIAGNOSIS — R931 Abnormal findings on diagnostic imaging of heart and coronary circulation: Secondary | ICD-10-CM

## 2023-10-31 DIAGNOSIS — R072 Precordial pain: Secondary | ICD-10-CM | POA: Diagnosis not present

## 2023-10-31 NOTE — Progress Notes (Signed)
 FFR order

## 2023-11-04 ENCOUNTER — Encounter: Payer: Self-pay | Admitting: Cardiology

## 2023-11-04 ENCOUNTER — Encounter: Payer: Self-pay | Admitting: Neurology

## 2023-11-04 MED ORDER — ASPIRIN 81 MG PO TBEC: 81.0000 mg | DELAYED_RELEASE_TABLET | Freq: Every day | ORAL | 3 refills | Status: AC

## 2023-11-05 ENCOUNTER — Telehealth: Payer: Self-pay

## 2023-11-05 NOTE — Telephone Encounter (Signed)
CT AngioResults reviewed with pt as per Dr. Krasowski's note.  Pt verbalized understanding and had no additional questions. Routed to PCP  

## 2023-11-12 ENCOUNTER — Other Ambulatory Visit: Payer: Self-pay | Admitting: Family Medicine

## 2023-11-13 ENCOUNTER — Encounter: Payer: Self-pay | Admitting: Neurology

## 2023-11-13 NOTE — Procedures (Signed)
 [facility] PAP TITRATION INTERPRETATION REPORT   STUDY DATE: 10/20/2023      PATIENT NAME:  Jesse [name]         DATE OF BIRTH:  [birth year]  PATIENT ID:  [id number]    TYPE OF STUDY:  CPAP  READING PHYSICIAN: Neomia Banner, MD SCORING TECHNICIAN: Octavia Belton, RPSGT   HISTORY: This 53 year-old patient was seen upon referral on 07-30-2023 for [facility] , transfer of care, initiated by his PCP.  He has been already on CPAP.  Reports his machine is no longer alleviating his symptoms: "I wake up unrefreshed and unrestored, I snore through the mask, needing a new machine and supplies". The patient has a brother and father with OSA/. he underwent a HST which resulted in 08-2023 as follows; Mr. Mcglasson HST confirms the presence of severe and according to the calculation of this device all obstructive sleep apnea with an AHI of 60 there was no significant REM sleep accentuation positional also played only a minor role.  Very concerning is the degree of hypoxia that Mr. Hayes Lipps presented with a nadir of 79% oxygen saturation and a total of 15 minutes over 10% of the sleep study in hypoxemia are clinically significant.   If he would use an oxygen saturation under 90% as a guideline, the desaturation time would have been 71 minutes -close to 15% of total sleep time. Snoring was moderately - loud   The Epworth Sleepiness Scale was endorsed at 8 out of 24 (scores above or equal to 10 are suggestive of hypersomnolence).  DESCRIPTION: A sleep technologist was in attendance for the duration of the recording.  Data collection, scoring, video monitoring, and reporting were performed in compliance with the AASM Manual for the Scoring of Sleep and Associated Events. A physician certified by the American Board of Sleep Medicine reviewed each epoch of the study.  ADDITIONAL INFORMATION:  Height: 72.0 in Weight: 282 lbs (BMI 38) Neck Size: 19.5 in  MEDICATIONS: Zovirax , Zyloprim ,  Xanax , Lipitor, Wellbutrin , Celexa , Farxiga , Lofibra, Vascepa , Zestoretic , Semaglutide , Viagra , Drisdol   SLEEP CONTINUITY AND SLEEP ARCHITECTURE:  Lights off was at 21:42: and lights on 04:47: (7.1 hours in bed).  Total sleep time was 364.5  minutes with a normal sleep efficiency at 85.9%. Sleep latency was increased at 39.0 minutes.  Of the total sleep time, the percentage of stage N1 sleep was 4.4%, stage N2 sleep was 85.3%, stage N3 sleep was 0.0%, and REM sleep was 10.3%. There were 3 Stage R periods observed on this study night, 13 awakenings (i.e. transitions to Stage W from any sleep stage), and 40.0 total stage transitions.  Wake after sleep onset (WASO) time accounted for 21 minutes.   RESPIRATORY MONITORING:    Based on AASM criteria (using a 3% oxygen desaturation and /or arousal rule for scoring hypopneas), there were 0 apneas (0 obstructive; 0 central; 0 mixed), and 10 hypopneas. Apnea index was 0.0. Hypopnea index was 1.6/h. The apnea-hypopnea index was 1.6/h overall (2.0 supine, 0.0 non-supine; 1.6 REM, 1.6 supine REM).  There were 0 respiratory effort-related arousals (RERAs).  The RERA index was 0.0 events/h. OXIMETRY: Total sleep time spent at, or below 88% was 6.5 minutes, or 1.8% of total sleep time. Snoring was classified as absent. Respiratory events were associated with oxyhemoglobin desaturations  , the nadir during sleep was  83% from a mean of 95%.  CARDIAC: The electrocardiogram documented NSR and isolated PVCs.  The average heart rate during sleep was  75 bpm.  The maximum heart rate during sleep was 92 bpm. The maximum heart rate during recording was 92.  BODY POSITION: Duration of total sleep and percent of total sleep in their respective position is as follows: supine 304 minutes (83.5%), non-supine 60.0 minutes (16.5%); right 00 minutes (0.0%), left 60 minutes (16.5%), and prone 00 minutes (0.0%). Total supine REM sleep time was 37 minutes (100.0% of total REM sleep). LIMB  MOVEMENTS: There were 26 periodic limb movements of sleep (4.3/h), of which 5 (0.8/h) were associated with an arousal. AROUSAL: There were 45 arousals in total, for an arousal index of 7.4 arousals/hour.  Of these, 4 were identified as respiratory-related arousals (0.7 /h), 5 were PLM-related arousals (0.8 /h), and 30 were non-specific arousals (4.9 /h)  IMPRESSION:  This study was initiated with a Simplus FFM in medium size and at 5 cm water pressure. Titration to a final pressure of 9 cm water allowed for REM sleep to be recorded and an AHI of 0.9/h.  there was no additional oxygen supplementation necessary as CPAP corrected the hypoxia seen in the baseline HST.    RECOMMENDATIONS:   Auto CPAP device order  , preferred is model  ResMed AirSense 11 with a setting from 5-11 cm water pressure, 1 cm EPR and Simplus  FFM medium size , heated humidification.   Neomia Banner, MD            [facility] CPAP/Bilevel Report    General Information  Name: Jesse [name] BMI: 16 Physician: ,   ID: [id number] Height: 72 in Technician: Octavia Belton  Sex: Male Weight: 282 lb Record: [id number]  Age: 70 [18-Apr-1971] Date: 10/20/2023 Scorer: Octavia Belton   Recommended Settings IPAP: N/A cmH20 EPAP: N/A cmH2O AHI: N/A AHI (4%): N/A   Pressure IPAP/EPAP 00 05 / 00 05 07 09   O2 Vol 0.0 0.0 0.0 0.0 0.0  Time TRT 0.52m 1.67m 51.65m 25.14m 347.2m   TST 0.15m 0.60m 13.38m 25.30m 326.57m  Sleep Stage % Wake 0.0 0.0 0.0 0.0 5.2   % REM 0.0 0.0 0.0 0.0 11.5   % N1 0.0 0.0 23.1 0.0 4.0   % N2 0.0 0.0 76.9 100.0 84.5   % N3 0.0 0.0 0.0 0.0 0.0  Respiratory Total Events 0 0 1 4 5    Obs. Apn. 0 0 0 0 0   Mixed Apn. 0 0 0 0 0   Cen. Apn. 0 0 0 0 0   Hypopneas 0 0 1 4 5    AHI 0.00 0.00 4.62 9.41 0.92   Supine AHI 0.00 0.00 4.62 9.41 1.13   Prone AHI 0.00 0.00 0.00 0.00 0.00   Side AHI 0.00 0.00 0.00 0.00 0.00  Respiratory (4%) Hypopneas (4%) 0.00 0.00 1.00  4.00 1.00   AHI (4%) 0.00 0.00 4.62 9.41 0.18   Supine AHI (4%) 0.00 0.00 4.62 9.41 0.23   Prone AHI (4%) 0.00 0.00 0.00 0.00 0.00   Side AHI (4%) 0.00 0.00 0.00 0.00 0.00  Desat Profile <= 90% 0.34m 0.46m 0.74m 9.55m 1.57m   <= 80% 0.49m 0.1m 0.65m 0.65m 0.21m   <= 70% 0.71m 0.78m 0.85m 0.24m 0.50m   <= 60% 0.70m 0.65m 0.6m 0.40m 0.19m  Arousal Index Apnea 0.0 0.0 0.0 0.0 0.0   Hypopnea 0.0 0.0 0.0 2.4 0.6   LM 0.0 0.0 13.8 0.0 3.5   Spontaneous 0.0 0.0 4.6 0.0 5.3  [facility] CPAP Summary    General Information  Name: Khyson, Sebesta BMI: 40.98 Physician: Neomia Banner, MD  ID: [id number] Height: 72.0 in Technician: Octavia Belton, RPSGT  Sex: Male Weight: 282.0 lb Record: [id number]  Age: 66 [1970-08-11] Date: 10/20/2023     Medical & Medication History    Davante Gerke is a 53 y.o. male patient who is seen upon referral on 07/30/2023 from PCP for TOC of CPAP needs, has been on CPAP for over a decade, 25 years, and current machine is at least 53 years old. No retesting done, last sleep study was a full PSG in [facility], in a sleep lab Dr Alayne Allis. Chief concern according to patient : " I feel much more fatigued in AM, I snore through the machine and mask, and now wanted to get a new machine". I have the pleasure of seeing Zymir Napoli 07/30/23 a right-handed male with a known OSA sleep disorder. Sleep relevant medical history: Nocturia/ 1 time, no Sleep walking, no ENT surgery, no v braces, no retainers, no goiter. Family medical /sleep history: Father and brother on CPAP with OSA, father with CAD and DM, Social history: Patient is working as a Tax adviser, office based, and lives in a household with GF, no children at home, one daughter in college in [place] . - lives with 2 dogs. Tobacco use; none . ETOH use ; very rarely, Caffeine intake in form of Coffee( /) Soda( /) Tea ( iced tea, 4 day ) or energy drinks Exercise in form of -no  routine.  Zovirax , Zyloprim , Xanax , Lipitor, Wellbutrin , Celexa , Farxiga , Lofibra, Vascepa , Zestoretic , Semaglutide , Viagra , Drisdol    Sleep Disorder      Comments   Patient arrived for a CPAP titration polysomnogram. Procedure explained and all questions answered. Patient typically uses an Auto CPAP from 5cm, and up to 10cm while using a medium Simplus full face mask. CPAP started at 5cm/H20 with heated humidity using patient's mask, a medium Simplus full face mask, and pressure was increased to CPAP 9cmH2O, in an effort to control respiratory events and abolish snoring. No obvious cardiac arrhythmias noted. Patient slept supine and left. No significant PLMS observed. Patient had no restroom visit.    CPAP start time: 09:43:15 PM CPAP end time: 04:47:11 AM   Time Total Supine Side Prone Upright  Recording (TRT) 7h 3.33m 6h 0.38m 1h 3.32m 0h 0.54m 0h 0.41m  Sleep (TST) 6h 4.48m 5h 4.53m 1h 0.70m 0h 0.21m 0h 0.46m   Latency N1 N2 N3 REM Onset Per. Slp. Eff.  Actual 0h 38.43m 0h 41.35m 0h 0.3m 1h 36.43m 0h 38.19m 0h 38.26m 86.07%   Stg Dur Wake N1 N2 N3 REM  Total 56.0 16.0 311.0 0.0 37.5  Supine 54.5 15.0 252.0 0.0 37.5  Side 1.5 1.0 59.0 0.0 0.0  Prone 0.0 0.0 0.0 0.0 0.0  Upright 0.0 0.0 0.0 0.0 0.0   Stg % Wake N1 N2 N3 REM  Total 13.3 4.4 85.3 0.0 10.3  Supine 13.0 4.1 69.1 0.0 10.3  Side 0.4 0.3 16.2 0.0 0.0  Prone 0.0 0.0 0.0 0.0 0.0  Upright 0.0 0.0 0.0 0.0 0.0     Apnea Summary Sub Supine Side Prone Upright  Total 0 Total 0 0 0 0 0    REM 0 0 0 0 0    NREM 0 0 0 0 0  Obs 0 REM 0 0 0 0 0    NREM 0 0 0 0 0  Mix 0  REM 0 0 0 0 0    NREM 0 0 0 0 0  Cen 0 REM 0 0 0 0 0    NREM 0 0 0 0 0   Rera Summary Sub Supine Side Prone Upright  Total 0 Total 0 0 0 0 0    REM 0 0 0 0 0    NREM 0 0 0 0 0   Hypopnea Summary Sub Supine Side Prone Upright  Total 10 Total 10 10 0 0 0    REM 1 1 0 0 0    NREM 9 9 0 0 0   4% Hypopnea Summary Sub Supine Side Prone Upright  Total (4%) 6 Total 6 6 0 0 0     REM 1 1 0 0 0    NREM 5 5 0 0 0     AHI Total Obs Mix Cen  1.65 Apnea 0.00 0.00 0.00 0.00   Hypopnea 1.65 -- -- --  0.99 Hypopnea (4%) 0.99 -- -- --    Total Supine Side Prone Upright  Position AHI 1.65 1.97 0.00 0.00 0.00  REM AHI 1.60   NREM AHI 1.65   Position RDI 1.65 1.97 0.00 0.00 0.00  REM RDI 1.60   NREM RDI 1.65    4% Hypopnea Total Supine Side Prone Upright  Position AHI (4%) 0.99 1.18 0.00 0.00 0.00  REM AHI (4%) 1.60   NREM AHI (4%) 0.92   Position RDI (4%) 0.99 1.18 0.00 0.00 0.00  REM RDI (4%) 1.60   NREM RDI (4%) 0.92    Desaturation Information  <100% <90% <80% <70% <60% <50% <40%  Supine 9 6 0 0 0 0 0  Side 1 0 0 0 0 0 0  Prone 0 0 0 0 0 0 0  Upright 0 0 0 0 0 0 0  Total 10 6 0 0 0 0 0  Desaturation threshold setting: 4% Minimum desaturation setting: 10 seconds SaO2 nadir: 83% The longest event was a 43 sec obstructive Hypopnea with a minimum SaO2 of 86%. The lowest SaO2 was 83% associated with a 27 sec obstructive Hypopnea. EKG Rates EKG Avg Max Min  Awake 72 83 61  Asleep 75 92 61

## 2023-11-13 NOTE — Addendum Note (Signed)
 Addended by: Neomia Banner on: 11/13/2023 12:44 PM   Modules accepted: Orders

## 2023-11-14 DIAGNOSIS — Z7985 Long-term (current) use of injectable non-insulin antidiabetic drugs: Secondary | ICD-10-CM | POA: Diagnosis not present

## 2023-11-14 DIAGNOSIS — Z7984 Long term (current) use of oral hypoglycemic drugs: Secondary | ICD-10-CM | POA: Diagnosis not present

## 2023-11-14 DIAGNOSIS — R208 Other disturbances of skin sensation: Secondary | ICD-10-CM | POA: Diagnosis not present

## 2023-11-14 DIAGNOSIS — E1165 Type 2 diabetes mellitus with hyperglycemia: Secondary | ICD-10-CM | POA: Diagnosis not present

## 2023-11-14 DIAGNOSIS — E781 Pure hyperglyceridemia: Secondary | ICD-10-CM | POA: Diagnosis not present

## 2023-11-30 ENCOUNTER — Other Ambulatory Visit: Payer: Self-pay | Admitting: Family Medicine

## 2023-12-02 ENCOUNTER — Ambulatory Visit: Payer: Self-pay

## 2023-12-11 DIAGNOSIS — G5792 Unspecified mononeuropathy of left lower limb: Secondary | ICD-10-CM | POA: Diagnosis not present

## 2023-12-11 DIAGNOSIS — M898X7 Other specified disorders of bone, ankle and foot: Secondary | ICD-10-CM | POA: Diagnosis not present

## 2023-12-11 DIAGNOSIS — M25872 Other specified joint disorders, left ankle and foot: Secondary | ICD-10-CM | POA: Diagnosis not present

## 2023-12-17 DIAGNOSIS — G4733 Obstructive sleep apnea (adult) (pediatric): Secondary | ICD-10-CM | POA: Diagnosis not present

## 2023-12-18 ENCOUNTER — Encounter: Payer: Self-pay | Admitting: Family Medicine

## 2023-12-18 MED ORDER — OZEMPIC (2 MG/DOSE) 8 MG/3ML ~~LOC~~ SOPN: 2.0000 mg | PEN_INJECTOR | SUBCUTANEOUS | 0 refills | Status: DC

## 2023-12-22 ENCOUNTER — Other Ambulatory Visit: Payer: Self-pay | Admitting: Family Medicine

## 2023-12-22 DIAGNOSIS — E1169 Type 2 diabetes mellitus with other specified complication: Secondary | ICD-10-CM

## 2023-12-23 ENCOUNTER — Ambulatory Visit (INDEPENDENT_AMBULATORY_CARE_PROVIDER_SITE_OTHER): Admitting: Family Medicine

## 2023-12-23 ENCOUNTER — Encounter: Payer: Self-pay | Admitting: Family Medicine

## 2023-12-23 VITALS — BP 138/80 | HR 100 | Temp 98.0°F | Resp 16 | Ht 73.0 in | Wt 278.2 lb

## 2023-12-23 DIAGNOSIS — R442 Other hallucinations: Secondary | ICD-10-CM | POA: Diagnosis not present

## 2023-12-23 MED ORDER — FLUTICASONE PROPIONATE 50 MCG/ACT NA SUSP: 2.0000 | Freq: Every day | NASAL | 6 refills | Status: AC

## 2023-12-23 NOTE — Progress Notes (Signed)
 CC: Olfatory disorder  Subjective: Patient is a 53 y.o. male here for smelling smoke.  1.5 weeks it has been happening. Will smell smoke for a few minutes and it goes away spontaneously. Faint smell of cigarettes. No dreaming of this. No trauma. No neuro s/s's. No other senses, including taste, are involved. Denies headaches, hx of of seizures or seizure like activities.   Past Medical History:  Diagnosis Date   Arthritis    Diabetes mellitus without complication (HCC)    on meds   Gout    Hyperlipidemia    on meds   Hypertension    on meds   Sleep apnea    uses CPAP    Objective: BP 138/80 (BP Location: Left Arm, Patient Position: Sitting)   Pulse 100   Temp 98 F (36.7 C) (Oral)   Resp 16   Ht 6\' 1"  (1.854 m)   Wt 278 lb 3.2 oz (126.2 kg)   SpO2 97%   BMI 36.70 kg/m  General: Awake, appears stated age HEENT: Ears patent, TM's neg, no sinus ttp, nares patent w/o drainage, MMM Neuro: Gait nml. DTR's equal and symmetric throughout. No clonus. 5/5 strength throughout. CN2-12 intact.  Lungs:  No accessory muscle use Psych: Age appropriate judgment and insight, normal affect and mood  Assessment and Plan: Olfactory hallucinations - Plan: Ambulatory referral to ENT, fluticasone (FLONASE) 50 MCG/ACT nasal spray  Refer ENT. Consider MRI if they clear him. INCS. Neuro exam reassuring. Hx reassuring. Could consider neuro eval but does not sound like a migraine or seizure aura.  F/u as originally scheduled.  The patient voiced understanding and agreement to the plan.  Shellie Dials [place], DO 12/23/23  2:22 PM

## 2023-12-23 NOTE — Patient Instructions (Signed)
 If you do not hear anything about your referral in the next 1-2 weeks, call our office and ask for an update.  Flonase (fluticasone); nasal spray that is over the counter. 2 sprays each nostril, once daily. Aim towards the same side eye when you spray.  Let us  know if you need anything.

## 2023-12-25 ENCOUNTER — Telehealth: Payer: Self-pay | Admitting: Neurology

## 2023-12-25 NOTE — Telephone Encounter (Signed)
 Called Pt  to schedule initial CPAP appt ( between 01/16/2024-03/16/2024)  No answer LVM to call back

## 2023-12-26 ENCOUNTER — Other Ambulatory Visit: Payer: Self-pay | Admitting: Family Medicine

## 2024-01-08 NOTE — Telephone Encounter (Signed)
 Call Pt 3x, No answer , LVM to call back

## 2024-01-10 ENCOUNTER — Other Ambulatory Visit: Payer: Self-pay | Admitting: Family Medicine

## 2024-01-14 ENCOUNTER — Encounter (INDEPENDENT_AMBULATORY_CARE_PROVIDER_SITE_OTHER): Payer: Self-pay

## 2024-01-16 ENCOUNTER — Other Ambulatory Visit: Payer: Self-pay | Admitting: Family Medicine

## 2024-01-17 DIAGNOSIS — G4733 Obstructive sleep apnea (adult) (pediatric): Secondary | ICD-10-CM | POA: Diagnosis not present

## 2024-01-31 ENCOUNTER — Other Ambulatory Visit: Payer: Self-pay | Admitting: Family Medicine

## 2024-02-16 DIAGNOSIS — G4733 Obstructive sleep apnea (adult) (pediatric): Secondary | ICD-10-CM | POA: Diagnosis not present

## 2024-02-24 ENCOUNTER — Other Ambulatory Visit: Payer: Self-pay | Admitting: Family Medicine

## 2024-02-24 DIAGNOSIS — E119 Type 2 diabetes mellitus without complications: Secondary | ICD-10-CM | POA: Diagnosis not present

## 2024-02-24 DIAGNOSIS — Z13220 Encounter for screening for lipoid disorders: Secondary | ICD-10-CM | POA: Diagnosis not present

## 2024-02-24 DIAGNOSIS — I10 Essential (primary) hypertension: Secondary | ICD-10-CM

## 2024-02-24 DIAGNOSIS — E782 Mixed hyperlipidemia: Secondary | ICD-10-CM | POA: Diagnosis not present

## 2024-02-24 DIAGNOSIS — E781 Pure hyperglyceridemia: Secondary | ICD-10-CM | POA: Diagnosis not present

## 2024-02-25 ENCOUNTER — Encounter: Payer: Self-pay | Admitting: Family Medicine

## 2024-02-26 ENCOUNTER — Telehealth: Payer: Self-pay

## 2024-02-26 ENCOUNTER — Other Ambulatory Visit (HOSPITAL_COMMUNITY): Payer: Self-pay

## 2024-02-26 MED ORDER — ICOSAPENT ETHYL 1 G PO CAPS: 2.0000 g | ORAL_CAPSULE | Freq: Two times a day (BID) | ORAL | 1 refills | Status: DC

## 2024-02-26 MED ORDER — ICOSAPENT ETHYL 1 G PO CAPS
2.0000 g | ORAL_CAPSULE | Freq: Two times a day (BID) | ORAL | 1 refills | Status: DC
Start: 2024-02-26 — End: 2024-02-26

## 2024-02-26 NOTE — Addendum Note (Signed)
 Addended by: DORLENE CHIQUITA RAMAN on: 02/26/2024 09:19 AM   Modules accepted: Orders

## 2024-02-26 NOTE — Telephone Encounter (Signed)
 Pharmacy Patient Advocate Encounter   Received notification from CoverMyMeds that prior authorization for Icosapent  Ethyl 1GM capsules  is required/requested.   Insurance verification completed.   The patient is insured through [facility]   Per test claim: PA required; PA started via CoverMyMeds Prior Auth (EOC) ID:  [id number]  . questions have been answered and PA submitted.TO PLAN. PA currently Pending.    KEY [id number]

## 2024-02-27 ENCOUNTER — Other Ambulatory Visit (HOSPITAL_COMMUNITY): Payer: Self-pay

## 2024-02-27 NOTE — Telephone Encounter (Signed)
 Pharmacy Patient Advocate Encounter  Received notification from [employer] that Prior Authorization for  Icosapent  Ethyl 1GM capsules  has been APPROVED from 02/26/24 to 02/24/25. Ran test claim, Copay is $10 for 30 day supply. This test claim was processed through [facility]- copay amounts may vary at other pharmacies due to pharmacy/plan contracts, or as the patient moves through the different stages of their insurance plan.   PA #/Case ID/Reference #: [id number]

## 2024-02-27 NOTE — Telephone Encounter (Signed)
 Sent pt message

## 2024-03-04 ENCOUNTER — Telehealth: Payer: Self-pay | Admitting: Pharmacy Technician

## 2024-03-04 NOTE — Telephone Encounter (Signed)
 Pharmacy Patient Advocate Encounter   Received notification from CoverMyMeds that prior authorization for Ozempic  (1 MG/DOSE) 4MG /3ML pen-injectors is due for renewal.   Insurance verification completed.   The patient is insured through [employer].  Action: Medication has been discontinued. Archived Key: [id number]

## 2024-03-05 ENCOUNTER — Other Ambulatory Visit (HOSPITAL_COMMUNITY): Payer: Self-pay

## 2024-03-18 DIAGNOSIS — G4733 Obstructive sleep apnea (adult) (pediatric): Secondary | ICD-10-CM | POA: Diagnosis not present

## 2024-04-04 ENCOUNTER — Other Ambulatory Visit: Payer: Self-pay | Admitting: Family Medicine

## 2024-04-18 DIAGNOSIS — G4733 Obstructive sleep apnea (adult) (pediatric): Secondary | ICD-10-CM | POA: Diagnosis not present

## 2024-04-21 ENCOUNTER — Other Ambulatory Visit: Payer: Self-pay | Admitting: Family Medicine

## 2024-05-19 ENCOUNTER — Other Ambulatory Visit: Payer: Self-pay | Admitting: Family Medicine

## 2024-05-19 NOTE — Telephone Encounter (Signed)
 Due for appt. Plz sched. Thx.

## 2024-05-19 NOTE — Telephone Encounter (Signed)
 Called pt lvm and sent message for him schedule appt on refill on Alprazolam  0.5mg .

## 2024-05-28 ENCOUNTER — Other Ambulatory Visit: Payer: Self-pay | Admitting: Family Medicine

## 2024-05-28 MED ORDER — ALPRAZOLAM 0.5 MG PO TABS: 0.5000 mg | ORAL_TABLET | Freq: Every evening | ORAL | 0 refills | Status: DC | PRN

## 2024-06-11 DIAGNOSIS — E1165 Type 2 diabetes mellitus with hyperglycemia: Secondary | ICD-10-CM | POA: Diagnosis not present

## 2024-06-11 DIAGNOSIS — E781 Pure hyperglyceridemia: Secondary | ICD-10-CM | POA: Diagnosis not present

## 2024-06-15 ENCOUNTER — Ambulatory Visit: Admitting: Family Medicine

## 2024-06-15 ENCOUNTER — Encounter: Payer: Self-pay | Admitting: Family Medicine

## 2024-06-15 ENCOUNTER — Ambulatory Visit: Payer: Self-pay | Admitting: Family Medicine

## 2024-06-15 VITALS — BP 124/78 | HR 92 | Temp 98.0°F | Resp 16 | Ht 73.0 in | Wt 269.0 lb

## 2024-06-15 DIAGNOSIS — Z13220 Encounter for screening for lipoid disorders: Secondary | ICD-10-CM

## 2024-06-15 DIAGNOSIS — Z125 Encounter for screening for malignant neoplasm of prostate: Secondary | ICD-10-CM

## 2024-06-15 DIAGNOSIS — E119 Type 2 diabetes mellitus without complications: Secondary | ICD-10-CM

## 2024-06-15 DIAGNOSIS — Z0000 Encounter for general adult medical examination without abnormal findings: Secondary | ICD-10-CM

## 2024-06-15 DIAGNOSIS — Z23 Encounter for immunization: Secondary | ICD-10-CM | POA: Diagnosis not present

## 2024-06-15 DIAGNOSIS — Z6835 Body mass index (BMI) 35.0-35.9, adult: Secondary | ICD-10-CM

## 2024-06-15 DIAGNOSIS — E669 Obesity, unspecified: Secondary | ICD-10-CM

## 2024-06-15 LAB — COMPREHENSIVE METABOLIC PANEL WITH GFR
ALT: 19 U/L (ref 0–53)
AST: 27 U/L (ref 0–37)
Albumin: 4.8 g/dL (ref 3.5–5.2)
Alkaline Phosphatase: 45 U/L (ref 39–117)
BUN: 18 mg/dL (ref 6–23)
CO2: 30 meq/L (ref 19–32)
Calcium: 9.7 mg/dL (ref 8.4–10.5)
Chloride: 99 meq/L (ref 96–112)
Creatinine, Ser: 1.16 mg/dL (ref 0.40–1.50)
GFR: 72.2 mL/min (ref >60.00)
Glucose, Bld: 153 mg/dL — ABNORMAL HIGH (ref 70–99)
Potassium: 3.8 meq/L (ref 3.5–5.1)
Sodium: 137 meq/L (ref 135–145)
Total Bilirubin: 0.6 mg/dL (ref 0.2–1.2)
Total Protein: 7.5 g/dL (ref 6.0–8.3)

## 2024-06-15 LAB — LIPID PANEL
Cholesterol: 185 mg/dL (ref 0–200)
HDL: 26.4 mg/dL — ABNORMAL LOW (ref >39.00)
NonHDL: 159.03
Total CHOL/HDL Ratio: 7
Triglycerides: 1242 mg/dL — ABNORMAL HIGH (ref 0.0–149.0)
VLDL: 248.4 mg/dL — ABNORMAL HIGH (ref 0.0–40.0)

## 2024-06-15 LAB — MICROALBUMIN / CREATININE URINE RATIO
Creatinine,U: 67.4 mg/dL
Microalb Creat Ratio: 13.2 mg/g (ref 0.0–30.0)
Microalb, Ur: 0.9 mg/dL (ref 0.0–1.9)

## 2024-06-15 LAB — URIC ACID: Uric Acid, Serum: 4.9 mg/dL (ref 4.0–7.8)

## 2024-06-15 LAB — CBC
HCT: 41.3 % (ref 39.0–52.0)
Hemoglobin: 14.3 g/dL (ref 13.0–17.0)
MCHC: 34.7 g/dL (ref 30.0–36.0)
MCV: 87.2 fl (ref 78.0–100.0)
Platelets: 270 K/uL (ref 150.0–400.0)
RBC: 4.74 Mil/uL (ref 4.22–5.81)
RDW: 14.2 % (ref 11.5–15.5)
WBC: 6.1 K/uL (ref 4.0–10.5)

## 2024-06-15 LAB — LDL CHOLESTEROL, DIRECT: Direct LDL: 37 mg/dL

## 2024-06-15 LAB — PSA: PSA: 0.22 ng/mL (ref 0.10–4.00)

## 2024-06-15 MED ORDER — TIRZEPATIDE 12.5 MG/0.5ML ~~LOC~~ SOAJ: 12.5000 mg | SUBCUTANEOUS | Status: AC

## 2024-06-15 NOTE — Patient Instructions (Addendum)
 Give us  2-3 business days to get the results of your labs back.   Keep the diet clean and stay active.  Please schedule your eye exam.   Aim to do some physical exertion for 150 minutes per week. This is typically divided into 5 days per week, 30 minutes per day. The activity should be enough to get your heart rate up. Anything is better than nothing if you have time constraints.  Please get me a copy of your advanced directive form at your convenience.   The Shingrix vaccine (for shingles) is a 2 shot series spaced 2-6 months apart. It can make people feel low energy, achy and almost like they have the flu for 48 hours after injection. 1/5 people can have nausea and/or vomiting. Please plan accordingly when deciding on when to get this shot. Call our office for a nurse visit appointment to get this. The second shot of the series is less severe regarding the side effects, but it still lasts 48 hours.   Let us  know if you need anything.

## 2024-06-15 NOTE — Progress Notes (Signed)
 Chief Complaint  Patient presents with   Annual Exam    CPE    Well Male Jesse [name] is here for a complete physical.   His last physical was >1 year ago.  Current diet: in general, a healthy diet.  Current exercise: some walking Weight trend: intentionally losing Fatigue out of ordinary? No. Seat belt? Yes.   Advanced directive? No  Health maintenance Shingrix- No Colonoscopy- Yes Tetanus- Yes HIV- Yes Hep C- Yes   Past Medical History:  Diagnosis Date   Arthritis    Diabetes mellitus without complication (HCC)    on meds   Gout    Hyperlipidemia    on meds   Hypertension    on meds   Sleep apnea    uses CPAP      Past Surgical History:  Procedure Laterality Date   BICEPS TENDON REPAIR Left    COLONOSCOPY WITH PROPOFOL  N/A 01/01/2022   Procedure: COLONOSCOPY WITH PROPOFOL ;  Surgeon: Wilhelmenia Aloha Raddle., MD;  Location: [facility];  Service: Gastroenterology;  Laterality: N/A;   COLONOSCOPY WITH PROPOFOL  N/A 06/24/2022   Procedure: COLONOSCOPY WITH PROPOFOL ;  Surgeon: Wilhelmenia Aloha Raddle., MD;  Location: [facility];  Service: Gastroenterology;  Laterality: N/A;   COLONOSCOPY WITH PROPOFOL  N/A 03/31/2023   Procedure: COLONOSCOPY WITH PROPOFOL ;  Surgeon: Wilhelmenia Aloha Raddle., MD;  Location: [facility];  Service: Gastroenterology;  Laterality: N/A;   ENDOSCOPIC MUCOSAL RESECTION N/A 01/01/2022   Procedure: ENDOSCOPIC MUCOSAL RESECTION;  Surgeon: Wilhelmenia Aloha Raddle., MD;  Location: [facility];  Service: Gastroenterology;  Laterality: N/A;  ICV polyp   ENDOSCOPIC MUCOSAL RESECTION N/A 06/24/2022   Procedure: ENDOSCOPIC MUCOSAL RESECTION;  Surgeon: Wilhelmenia Aloha Raddle., MD;  Location: [facility];  Service: Gastroenterology;  Laterality: N/A;   ENDOSCOPIC MUCOSAL RESECTION N/A 03/31/2023   Procedure: ENDOSCOPIC MUCOSAL RESECTION;  Surgeon: Wilhelmenia Aloha Raddle., MD;  Location: [facility];  Service: Gastroenterology;  Laterality: N/A;    HEMOSTASIS CLIP PLACEMENT  06/24/2022   Procedure: HEMOSTASIS CLIP PLACEMENT;  Surgeon: Wilhelmenia Aloha Raddle., MD;  Location: [facility];  Service: Gastroenterology;;   HEMOSTASIS CONTROL  03/31/2023   Procedure: HEMOSTASIS CONTROL;  Surgeon: Wilhelmenia Aloha Raddle., MD;  Location: [facility];  Service: Gastroenterology;;   HERNIA REPAIR  2019   HIP SURGERY Right 01/2022   HOT HEMOSTASIS N/A 01/01/2022   Procedure: HOT HEMOSTASIS (ARGON PLASMA COAGULATION/BICAP);  Surgeon: Wilhelmenia Aloha Raddle., MD;  Location: [facility];  Service: Gastroenterology;  Laterality: N/A;  ICV EMR   KNEE SURGERY Left 2018   POLYPECTOMY  01/01/2022   Procedure: POLYPECTOMY;  Surgeon: Mansouraty, Aloha Raddle., MD;  Location: [facility];  Service: Gastroenterology;;  cecal   POLYPECTOMY  06/24/2022   Procedure: POLYPECTOMY;  Surgeon: Wilhelmenia Aloha Raddle., MD;  Location: [facility];  Service: Gastroenterology;;   ROBLEY MEYER INJECTION  01/01/2022   Procedure: SUBMUCOSAL LIFTING INJECTION;  Surgeon: Wilhelmenia Aloha Raddle., MD;  Location: [facility];  Service: Gastroenterology;;  ICV EMR   SUBMUCOSAL LIFTING INJECTION  06/24/2022   Procedure: SUBMUCOSAL LIFTING INJECTION;  Surgeon: Wilhelmenia Aloha Raddle., MD;  Location: [facility];  Service: Gastroenterology;;    Medications  Current Outpatient Medications on File Prior to Visit  Medication Sig Dispense Refill   acyclovir  (ZOVIRAX ) 400 MG tablet TAKE 1 TABLET TWICE A DAY 180 tablet 3   allopurinol  (ZYLOPRIM ) 300 MG tablet TAKE 1 TABLET DAILY 90 tablet 3   ALPRAZolam  (XANAX ) 0.5 MG tablet Take 1 tablet (0.5 mg total) by mouth at bedtime as needed for  anxiety. TAKE 1 TABLET BY MOUTH AT BEDTIME AS NEEDED FOR ANXIETY 30 tablet 0   aspirin  EC 81 MG tablet Take 1 tablet (81 mg total) by mouth daily. Swallow whole. 90 tablet 3   atorvastatin  (LIPITOR) 40 MG tablet TAKE 1 TABLET DAILY 90 tablet 3   buPROPion  (WELLBUTRIN  XL) 300 MG 24 hr tablet TAKE 1  TABLET DAILY 90 tablet 3   citalopram  (CELEXA ) 10 MG tablet Take 1 tablet by mouth once daily 30 tablet 0   Continuous Glucose Sensor (FREESTYLE LIBRE 3 SENSOR) MISC Place 1 sensor on the skin every 14 days. Use to check glucose continuously 2 each 5   fenofibrate  micronized (LOFIBRA) 200 MG capsule Take 1 capsule (200 mg total) by mouth daily before breakfast. 90 capsule 3   fluticasone  (FLONASE ) 50 MCG/ACT nasal spray Place 2 sprays into both nostrils daily. 16 g 6   icosapent  Ethyl (VASCEPA ) 1 g capsule Take 2 capsules (2 g total) by mouth 2 (two) times daily. 360 capsule 1   JARDIANCE  25 MG TABS tablet TAKE 1 TABLET BY MOUTH ONCE DAILY BEFORE BREAKFAST 30 tablet 0   lisinopril -hydrochlorothiazide  (ZESTORETIC ) 20-12.5 MG tablet Take 1 tablet by mouth daily. 90 tablet 2   Semaglutide , 2 MG/DOSE, (OZEMPIC , 2 MG/DOSE,) 8 MG/3ML SOPN Inject 2 mg into the skin once a week. 3 mL 0   sildenafil  (VIAGRA ) 100 MG tablet TAKE 1/2 TO 1 (ONE-HALF TO ONE) TABLET BY MOUTH ONCE DAILY AS NEEDED FOR ERECTILE DYSFUNCTION 30 tablet 0   Vitamin D , Ergocalciferol , (DRISDOL ) 1.25 MG (50000 UNIT) CAPS capsule TAKE 1 CAPSULE EVERY 7 DAYS 12 capsule 3    Allergies Allergies  Allergen Reactions   Metformin And Related Diarrhea    Family History Family History  Problem Relation Age of Onset   Heart disease Father    Colon polyps Sister 32   Colon polyps Brother 17   Heart disease Paternal Grandmother    Heart disease Paternal Grandfather    Colon cancer Neg Hx    Esophageal cancer Neg Hx    Rectal cancer Neg Hx    Stomach cancer Neg Hx    Inflammatory bowel disease Neg Hx    Liver disease Neg Hx    Pancreatic cancer Neg Hx     Review of Systems: Constitutional:  no fevers Eye:  no recent significant change in vision Ear/Nose/Mouth/Throat:  Ears:  no hearing loss Nose/Mouth/Throat:  no complaints of nasal congestion, no sore throat Cardiovascular:  no chest pain Respiratory:  no shortness of  breath Gastrointestinal:  no change in bowel habits GU:  Male: negative for dysuria, frequency Musculoskeletal/Extremities:  no joint pain Integumentary (Skin/Breast):  no abnormal skin lesions reported Neurologic:  no headaches Endocrine: No unexpected weight changes Hematologic/Lymphatic:  no abnormal bleeding  Exam BP 124/78 (BP Location: Left Arm, Patient Position: Sitting)   Pulse 92   Temp 98 F (36.7 C) (Oral)   Resp 16   Ht 6' 1 (1.854 m)   Wt 269 lb (122 kg)   SpO2 96%   BMI 35.49 kg/m  General:  well developed, well nourished, in no apparent distress Skin:  no significant moles, warts, or growths Head:  no masses, lesions, or tenderness Eyes:  pupils equal and round, sclera anicteric without injection Ears:  canals without lesions, TMs shiny without retraction, no obvious effusion, no erythema Nose:  nares patent, mucosa normal Throat/Pharynx:  lips and gingiva without lesion; tongue and uvula midline; non-inflamed pharynx; no exudates or  postnasal drainage Neck: neck supple without adenopathy, thyromegaly, or masses Cardiac: RRR, no bruits, no LE edema Lungs:  clear to auscultation, breath sounds equal bilaterally, no respiratory distress Abdomen: BS+, soft, non-tender, non-distended, no masses or organomegaly noted Rectal: Deferred Musculoskeletal:  symmetrical muscle groups noted without atrophy or deformity Neuro:  gait normal; deep tendon reflexes normal and symmetric Psych: well oriented with normal range of affect and appropriate judgment/insight  Assessment and Plan  Well adult exam - Plan: CBC, Comprehensive metabolic panel with GFR, Lipid panel, Hepatitis B surface antibody,quantitative, Uric acid  Type 2 diabetes mellitus in patient with obesity (HCC) - Plan: Microalbumin / creatinine urine ratio, CANCELED: Hemoglobin A1c  Screening for prostate cancer - Plan: PSA   Well 53 y.o. male. Counseled on diet and exercise. Counseled on risks and benefits  of prostate cancer screening with PSA. The patient agrees to undergo testing. Screen Hep B. Advised he sched his eye exam. He is following with endo. A1c controlled.  Advanced directive form provided today.  Flu shot today. PCV20 today. Shingrix rec'd.  Immunizations, labs, and further orders as above. Follow up in 6 mo. The patient voiced understanding and agreement to the plan.  Mabel Mt Valera, DO 06/15/24 8:54 AM

## 2024-06-15 NOTE — Addendum Note (Signed)
 Addended by: Shreena Baines M on: 06/15/2024 09:09 AM   Modules accepted: Orders

## 2024-06-16 LAB — HEPATITIS B SURFACE ANTIBODY, QUANTITATIVE: Hep B S AB Quant (Post): <5 m[IU]/mL — ABNORMAL LOW (ref >=10)

## 2024-06-18 DIAGNOSIS — G4733 Obstructive sleep apnea (adult) (pediatric): Secondary | ICD-10-CM | POA: Diagnosis not present

## 2024-07-12 ENCOUNTER — Other Ambulatory Visit: Payer: Self-pay | Admitting: Family Medicine

## 2024-07-12 NOTE — Telephone Encounter (Signed)
 Requesting: alprazolam  0.5mg   Contract: None UDS: None Last Visit: 06/15/24 Next Visit: None  Last Refill: 05/28/24 #30 and 0RF   Please Advise

## 2024-08-09 ENCOUNTER — Other Ambulatory Visit: Payer: Self-pay | Admitting: Family Medicine

## 2024-08-18 ENCOUNTER — Encounter: Payer: Self-pay | Admitting: Family Medicine

## 2024-08-19 MED ORDER — FENOFIBRATE MICRONIZED 200 MG PO CAPS: 200.0000 mg | ORAL_CAPSULE | Freq: Every day | ORAL | 1 refills | Status: AC

## 2024-09-13 ENCOUNTER — Ambulatory Visit: Admitting: Family Medicine
